# Patient Record
Sex: Female | Born: 1960
Health system: Southern US, Community
[De-identification: ages and names within clinical notes are randomized; demographics above are authoritative.]

## PROBLEM LIST (undated history)

## (undated) DIAGNOSIS — E739 Lactose intolerance, unspecified: Secondary | ICD-10-CM

## (undated) DIAGNOSIS — Z0000 Encounter for general adult medical examination without abnormal findings: Secondary | ICD-10-CM

## (undated) DIAGNOSIS — Z13220 Encounter for screening for lipoid disorders: Secondary | ICD-10-CM

## (undated) DIAGNOSIS — B029 Zoster without complications: Secondary | ICD-10-CM

## (undated) DIAGNOSIS — L290 Pruritus ani: Secondary | ICD-10-CM

## (undated) DIAGNOSIS — L309 Dermatitis, unspecified: Secondary | ICD-10-CM

## (undated) DIAGNOSIS — E7800 Pure hypercholesterolemia, unspecified: Secondary | ICD-10-CM

## (undated) HISTORY — DX: Encounter for screening for lipoid disorders: Z13.220

## (undated) HISTORY — DX: Dermatitis, unspecified: L30.9

## (undated) HISTORY — DX: Encounter for general adult medical examination without abnormal findings: Z00.00

## (undated) HISTORY — DX: Pure hypercholesterolemia, unspecified: E78.00

## (undated) HISTORY — PX: CHOLECYSTECTOMY: SHX55

## (undated) HISTORY — DX: Pruritus ani: L29.0

## (undated) HISTORY — DX: Zoster without complications: B02.9

## (undated) HISTORY — DX: Lactose intolerance, unspecified: E73.9

---

## 2005-09-26 ENCOUNTER — Encounter: Payer: Self-pay | Admitting: Family Medicine

## 2006-08-19 ENCOUNTER — Ambulatory Visit: Payer: Self-pay | Admitting: Family Medicine

## 2006-08-23 ENCOUNTER — Ambulatory Visit: Payer: Self-pay | Admitting: Family Medicine

## 2006-11-30 ENCOUNTER — Ambulatory Visit: Payer: Self-pay | Admitting: Family Medicine

## 2007-11-17 ENCOUNTER — Encounter: Payer: Self-pay | Admitting: Family Medicine

## 2007-11-17 DIAGNOSIS — E739 Lactose intolerance, unspecified: Secondary | ICD-10-CM | POA: Insufficient documentation

## 2007-11-20 ENCOUNTER — Ambulatory Visit: Payer: Self-pay | Admitting: Family Medicine

## 2007-11-27 ENCOUNTER — Ambulatory Visit: Payer: Self-pay | Admitting: Family Medicine

## 2007-12-04 DIAGNOSIS — E7800 Pure hypercholesterolemia, unspecified: Secondary | ICD-10-CM

## 2007-12-04 LAB — CONVERTED CEMR LAB
BUN: 11 mg/dL (ref 6–23)
CO2: 28 meq/L (ref 19–32)
Calcium: 9.5 mg/dL (ref 8.4–10.5)
Direct LDL: 149.8 mg/dL
GFR calc Af Amer: 99 mL/min
GFR calc non Af Amer: 82 mL/min
HDL: 46.1 mg/dL (ref >39.0)
Potassium: 4.5 meq/L (ref 3.5–5.1)
Total CHOL/HDL Ratio: 4.6
Triglycerides: 130 mg/dL (ref 0–149)
VLDL: 26 mg/dL (ref 0–40)

## 2007-12-12 ENCOUNTER — Ambulatory Visit: Payer: Self-pay | Admitting: Family Medicine

## 2007-12-12 ENCOUNTER — Encounter: Payer: Self-pay | Admitting: Family Medicine

## 2007-12-14 ENCOUNTER — Encounter (INDEPENDENT_AMBULATORY_CARE_PROVIDER_SITE_OTHER): Payer: Self-pay | Admitting: *Deleted

## 2008-02-01 ENCOUNTER — Ambulatory Visit: Payer: Self-pay | Admitting: Family Medicine

## 2008-03-05 ENCOUNTER — Ambulatory Visit: Payer: Self-pay | Admitting: Family Medicine

## 2008-03-08 LAB — CONVERTED CEMR LAB
Cholesterol: 198 mg/dL (ref 0–200)
LDL Cholesterol: 130 mg/dL — ABNORMAL HIGH (ref 0–99)

## 2008-07-30 ENCOUNTER — Ambulatory Visit: Payer: Self-pay | Admitting: Family Medicine

## 2008-08-28 ENCOUNTER — Ambulatory Visit: Payer: Self-pay | Admitting: Family Medicine

## 2008-08-28 LAB — CONVERTED CEMR LAB
Basophils Absolute: 0.1 10*3/uL (ref 0.0–0.1)
Basophils Relative: 1.7 % (ref 0.0–3.0)
HCT: 43.6 % (ref 36.0–46.0)
Hemoglobin: 15.2 g/dL — ABNORMAL HIGH (ref 12.0–15.0)
Lymphocytes Relative: 22 % (ref 12.0–46.0)
MCHC: 34.8 g/dL (ref 30.0–36.0)
Monocytes Absolute: 0.5 10*3/uL (ref 0.1–1.0)
Neutro Abs: 5.6 10*3/uL (ref 1.4–7.7)
RBC: 4.88 M/uL (ref 3.87–5.11)

## 2008-10-10 ENCOUNTER — Ambulatory Visit: Payer: Self-pay | Admitting: Family Medicine

## 2008-11-18 ENCOUNTER — Telehealth: Payer: Self-pay | Admitting: Family Medicine

## 2008-12-25 ENCOUNTER — Encounter: Payer: Self-pay | Admitting: Family Medicine

## 2008-12-25 ENCOUNTER — Other Ambulatory Visit: Admission: RE | Admit: 2008-12-25 | Discharge: 2008-12-25 | Payer: Self-pay | Admitting: Family Medicine

## 2008-12-25 ENCOUNTER — Ambulatory Visit: Payer: Self-pay | Admitting: Family Medicine

## 2008-12-31 ENCOUNTER — Encounter (INDEPENDENT_AMBULATORY_CARE_PROVIDER_SITE_OTHER): Payer: Self-pay | Admitting: *Deleted

## 2008-12-31 ENCOUNTER — Ambulatory Visit: Payer: Self-pay | Admitting: Family Medicine

## 2008-12-31 DIAGNOSIS — I10 Essential (primary) hypertension: Secondary | ICD-10-CM | POA: Insufficient documentation

## 2008-12-31 LAB — CONVERTED CEMR LAB
Albumin: 4.1 g/dL (ref 3.5–5.2)
BUN: 11 mg/dL (ref 6–23)
Cholesterol: 191 mg/dL (ref 0–200)
Creatinine, Ser: 0.8 mg/dL (ref 0.4–1.2)
GFR calc Af Amer: 98 mL/min
GFR calc non Af Amer: 81 mL/min
LDL Cholesterol: 112 mg/dL — ABNORMAL HIGH (ref 0–99)
Potassium: 4.4 meq/L (ref 3.5–5.1)
Triglycerides: 120 mg/dL (ref 0–149)
VLDL: 24 mg/dL (ref 0–40)

## 2009-01-01 ENCOUNTER — Encounter: Payer: Self-pay | Admitting: Family Medicine

## 2009-01-01 ENCOUNTER — Ambulatory Visit: Payer: Self-pay | Admitting: Family Medicine

## 2009-01-02 ENCOUNTER — Encounter: Payer: Self-pay | Admitting: Family Medicine

## 2009-01-03 ENCOUNTER — Encounter: Payer: Self-pay | Admitting: Family Medicine

## 2009-08-07 ENCOUNTER — Encounter (INDEPENDENT_AMBULATORY_CARE_PROVIDER_SITE_OTHER): Payer: Self-pay | Admitting: Internal Medicine

## 2009-08-07 ENCOUNTER — Ambulatory Visit: Payer: Self-pay | Admitting: Family Medicine

## 2009-08-07 LAB — CONVERTED CEMR LAB: Rapid Strep: NEGATIVE

## 2009-12-30 ENCOUNTER — Ambulatory Visit: Payer: Self-pay | Admitting: Family Medicine

## 2010-01-08 ENCOUNTER — Encounter: Payer: Self-pay | Admitting: Family Medicine

## 2010-01-08 ENCOUNTER — Ambulatory Visit: Payer: Self-pay | Admitting: Family Medicine

## 2010-01-09 ENCOUNTER — Ambulatory Visit: Payer: Self-pay | Admitting: Family Medicine

## 2010-01-12 LAB — CONVERTED CEMR LAB
Albumin: 4.5 g/dL (ref 3.5–5.2)
Alkaline Phosphatase: 66 units/L (ref 39–117)
Chloride: 102 meq/L (ref 96–112)
HDL: 63 mg/dL (ref >39)
LDL Cholesterol: 125 mg/dL — ABNORMAL HIGH (ref 0–99)
Potassium: 4.6 meq/L (ref 3.5–5.3)
Sodium: 140 meq/L (ref 135–145)
Total Protein: 7.9 g/dL (ref 6.0–8.3)
Triglycerides: 137 mg/dL (ref <150)
VLDL: 27 mg/dL (ref 0–40)

## 2010-05-29 ENCOUNTER — Ambulatory Visit: Payer: Self-pay | Admitting: Family Medicine

## 2010-05-29 DIAGNOSIS — N951 Menopausal and female climacteric states: Secondary | ICD-10-CM | POA: Insufficient documentation

## 2010-06-23 ENCOUNTER — Ambulatory Visit: Payer: Self-pay | Admitting: Family Medicine

## 2010-08-05 ENCOUNTER — Ambulatory Visit: Payer: Self-pay | Admitting: Internal Medicine

## 2010-08-05 DIAGNOSIS — H02849 Edema of unspecified eye, unspecified eyelid: Secondary | ICD-10-CM | POA: Insufficient documentation

## 2010-08-21 ENCOUNTER — Ambulatory Visit: Payer: Self-pay | Admitting: Family Medicine

## 2010-08-21 ENCOUNTER — Telehealth: Payer: Self-pay | Admitting: Family Medicine

## 2010-08-21 DIAGNOSIS — L2089 Other atopic dermatitis: Secondary | ICD-10-CM

## 2010-08-21 DIAGNOSIS — L299 Pruritus, unspecified: Secondary | ICD-10-CM | POA: Insufficient documentation

## 2010-12-09 ENCOUNTER — Telehealth: Payer: Self-pay | Admitting: Family Medicine

## 2011-01-11 ENCOUNTER — Encounter: Payer: Self-pay | Admitting: Family Medicine

## 2011-01-11 ENCOUNTER — Ambulatory Visit: Payer: Self-pay | Admitting: Family Medicine

## 2011-01-13 ENCOUNTER — Encounter (INDEPENDENT_AMBULATORY_CARE_PROVIDER_SITE_OTHER): Payer: Self-pay | Admitting: *Deleted

## 2011-01-26 NOTE — Assessment & Plan Note (Signed)
Summary: red and swollen eye/alc   Vital Signs:  Patient profile:   50 year old female Weight:      179.75 pounds Temp:     98.7 degrees F oral Pulse rate:   76 / minute Pulse rhythm:   regular BP sitting:   116 / 80  (left arm) Cuff size:   large  Vitals Entered By: Selena Batten Dance CMA (AAMA) (August 05, 2010 11:22 AM) CC: check eye/? exzema  Vision Screening:Left eye with correction: 20 / 25 Right eye with correction: 20 / 25 Both eyes with correction: 20 / 20        Vision Entered By: Selena Batten Dance CMA Duncan Dull) (August 05, 2010 12:12 PM)   History of Present Illness: CC: eye swelling  3d h/o periorbital itching started Sunday after spending time outside sweating, in AM eye very swollen and turned red.  No fevers, chills, no mouth swelling, trouble breathing or swallowing.  + using new mascara x 3-4 months, threw it away after right eye redness started.  + painful eye with lateral movements.  + h/o eczema, no h/o allergies.  + fm hx allergies and asthma, eczema.  No other rash, no n/v/d/c/abd pain/joint pain.  On prempro for menopause increased dose x 2 months, wonders if causing swelling of eye.  Allergies: No Known Drug Allergies PMH-FH-SH reviewed for relevance  Review of Systems       per HPI  Physical Exam  General:  overweight appearing female in NAd Head:  Normocephalic and atraumatic without obvious abnormalities. No apparent alopecia or balding. Eyes:  L eye WNL including fundoscopic exam.  R eye mild edema of eyelid, marked erythema periorbital region, PERRLA, conjunctiva white. EOMI, but some irritation with R eye lateral movement.  R fundscopic limited but no papilledema appreciated Ears:  External ear exam shows no significant lesions or deformities.  Otoscopic examination reveals clear canals, tympanic membranes are intact bilaterally without bulging, retraction, inflammation or discharge. Hearing is grossly normal bilaterally. Nose:  External nasal examination  shows no deformity or inflammation. Nasal mucosa are pink and moist without lesions or exudates. Mouth:  Oral mucosa and oropharynx without lesions or exudates.  Teeth in good repair. Neck:  No deformities, masses, or tenderness noted. Skin:  see eye exam.  no dry skin noted   Impression & Recommendations:  Problem # 1:  EDEMA OF EYELID (ICD-374.82)  with surrounding erythema.  likely allergic reaction to mascara recently used and sweat.  however, did have pain with eye movements today.  therefore concern for preseptal cellulitis.  Start Abx x 10 days, RTC for red flags and consider imaging to r/o orbital cellulitis picture.  No current fevers.  no systemic sxs.  Orders: Vision Screening ([address])  Complete Medication List: 1)  Prempro 0.625-2.5 Mg Tabs (Conj estrog-medroxyprogest ace) .Marland Kitchen.. 1 tab by mouth daily 2)  Augmentin 875-125 Mg Tabs (Amoxicillin-pot clavulanate) .... One two times a day x 10 days  Patient Instructions: 1)  This is likely an allergic reaction of your skin to something in the mascara or environment on Sunday. 2)  However, given you are having pain with eye movements, I'd like you to start antibiotic and take two times daily for next 10 days.  3)  No make up for next 2 weeks. 4)  Please return if having fevers, worsening swelling of eye or more pain with eye movements, or not improving as expected. 5)  Call clinic with questions.  Pleasure to meet you today. Prescriptions: AUGMENTIN  875-125 MG TABS (AMOXICILLIN-POT CLAVULANATE) one two times a day x 10 days  #20 x 0   Entered and Authorized by:   Eustaquio Boyden  MD   Signed by:   Eustaquio Boyden  MD on 08/05/2010   Method used:   Electronically to        [facility]       [address]       [place], [place]  [address]       Ph: [phone]       Fax: [phone]   RxID:   [id number]   Current Allergies (reviewed today): No known allergies

## 2011-01-26 NOTE — Assessment & Plan Note (Signed)
Summary: ROA FOR 3-4 WEEK FOLLOW-UP   Vital Signs:  Patient profile:   50 year old female Height:      63.25 inches Weight:      177.4 pounds BMI:     31.29 Temp:     98.8 degrees F oral Pulse rate:   80 / minute Pulse rhythm:   regular BP sitting:   110 / 70  (left arm) Cuff size:   regular  Vitals Entered By: Benny Lennert CMA Duncan Dull) (June 23, 2010 4:11 PM)  History of Present Illness: Chief complaint 3-4 week follow up  Seen 1 month ago for menopausal symptoms as detailed below:  Mood swings.. quick swithces. Increase in stress. Noted in last 2-3 year... worsening in last few months. Hot flashes at night as well as during the day. Exercise a lot..but having difficulty losing weiht.  Very fatigued. Last menses was 3 months ago, lighter flow. No intermittant bleeding . Very irregular menses in last few years. Mother went through menopause age 82.   In last month she has been on HRT... minimal change in symptoms.   Allergies (verified): No Known Drug Allergies  Past History:  Past medical, surgical, family and social histories (including risk factors) reviewed, and no changes noted (except as noted below).  Past Medical History: Reviewed history from 10/10/2008 and no changes required. Current Problems:  ANAL PRURITUS (ICD-698.0) HERPES ZOSTER (ICD-053.9) HYPERCHOLESTEROLEMIA (ICD-272.0) SCREENING FOR LIPOID DISORDERS (ICD-V77.91) PREVENTIVE HEALTH CARE (ICD-V70.0) OTHER SCREENING BREAST EXAMINATION (ICD-V76.19) GLUCOSE INTOLERANCE (ICD-271.3) History of Eczema  Past Surgical History: Reviewed history from 11/17/2007 and no changes required. 1999    Cholecystectomy  Family History: Reviewed history from 11/17/2007 and no changes required. Father: Died 69 throat and lung CA Mother: Died 29, DM, kidney failure Siblings: 1 brother DM, 1 sister healthy DM:  brother  Social History: Reviewed history from 12/25/2008 and no changes required. Former Smoker,  15 PYH Alcohol use-yes, light (2-3 x /week) Drug use-no Regular exercise-yes, walks stairs daily 20-25 minutes, runs daily Marital Status: Married x 20 years Children: 1 Occupation: Housewife Nutrition:  3 meals, fruit and vegetables  Review of Systems General:  Complains of fatigue. CV:  Denies chest pain or discomfort. Resp:  Denies shortness of breath.   Impression & Recommendations:  Problem # 1:  MENOPAUSAL SYNDROME (ICD-627.2) Increase medication dose. Encouraged exercise, weight loss, healthy eating habits.  Her updated medication list for this problem includes:    Prempro 0.625-2.5 Mg Tabs (Conj estrog-medroxyprogest ace) .Marland Kitchen... 1 tab by mouth daily  Complete Medication List: 1)  Prempro 0.625-2.5 Mg Tabs (Conj estrog-medroxyprogest ace) .Marland Kitchen.. 1 tab by mouth daily Prescriptions: PREMPRO 0.625-2.5 MG TABS (CONJ ESTROG-MEDROXYPROGEST ACE) 1 tab by mouth daily  #30 x 3   Entered and Authorized by:   Kerby Nora MD   Signed by:   Kerby Nora MD on 06/23/2010   Method used:   Electronically to        [facility]       [address]       [place], [place]  [address]       Ph: [phone]       Fax: [phone]   RxID:   [id number]   Current Allergies (reviewed today): No known allergies

## 2011-01-26 NOTE — Assessment & Plan Note (Signed)
Summary: PROBLEMS WITH MENAPAUSE/RBH 3O MIN PER HEATHER   Vital Signs:  Patient profile:   50 year old female Height:      63.25 inches Weight:      179.0 pounds BMI:     31.57 Temp:     98.2 degrees F oral Pulse rate:   80 / minute Pulse rhythm:   regular BP sitting:   120 / 78  (left arm) Cuff size:   regular  Vitals Entered By: Benny Lennert CMA Duncan Dull) (May 29, 2010 3:25 PM)  History of Present Illness: Mood swings.. quick swithces. Increase in stress. Noted in last 2-3 year... worsening in last few months. Hot flashes at night as well as during the day. Exercise a lot..but having difficulty losing weiht.  Very fatigued.  Last menses was 3 months ago, lighter flow. No intermittant bleeding . Very irregular menses in last few years. Mother went through menopause age 80.  Problems Prior to Update: 1)  Essential Hypertension, Benign  (ICD-401.1) 2)  Routine Gynecological Examination  (ICD-V72.31) 3)  Skin Rash  (ICD-782.1) 4)  Other Screening Mammogram  (ICD-V76.12) 5)  Eczema, Atopic  (ICD-691.8) 6)  Metrorrhagia  (ICD-626.6) 7)  Anal Pruritus  (ICD-698.0) 8)  Herpes Zoster  (ICD-053.9) 9)  Hypercholesterolemia  (ICD-272.0) 10)  Screening For Lipoid Disorders  (ICD-V77.91) 11)  Preventive Health Care  (ICD-V70.0) 12)  Other Screening Breast Examination  (ICD-V76.19) 13)  Glucose Intolerance  (ICD-271.3)  Current Medications (verified): 1)  Prempro 0.3-1.5 Mg Tabs (Conj Estrog-Medroxyprogest Ace) .Marland Kitchen.. 1 Tab By Mouth Daily  Allergies (verified): No Known Drug Allergies  Past History:  Past medical, surgical, family and social histories (including risk factors) reviewed, and no changes noted (except as noted below).  Past Medical History: Reviewed history from 10/10/2008 and no changes required. Current Problems:  ANAL PRURITUS (ICD-698.0) HERPES ZOSTER (ICD-053.9) HYPERCHOLESTEROLEMIA (ICD-272.0) SCREENING FOR LIPOID DISORDERS (ICD-V77.91) PREVENTIVE  HEALTH CARE (ICD-V70.0) OTHER SCREENING BREAST EXAMINATION (ICD-V76.19) GLUCOSE INTOLERANCE (ICD-271.3) History of Eczema  Past Surgical History: Reviewed history from 11/17/2007 and no changes required. 1999    Cholecystectomy  Family History: Reviewed history from 11/17/2007 and no changes required. Father: Died 6 throat and lung CA Mother: Died 99, DM, kidney failure Siblings: 1 brother DM, 1 sister healthy DM:  brother  Social History: Reviewed history from 12/25/2008 and no changes required. Former Smoker, 15 PYH Alcohol use-yes, light (2-3 x /week) Drug use-no Regular exercise-yes, walks stairs daily 20-25 minutes, runs daily Marital Status: Married x 20 years Children: 1 Occupation: Housewife Nutrition:  3 meals, fruit and vegetables  Review of Systems General:  Complains of fatigue; denies fever. CV:  Denies chest pain or discomfort. Resp:  Denies shortness of breath. GI:  Denies abdominal pain.  Physical Exam  General:  overweight appearing female in NAd Mouth:  Oral mucosa and oropharynx without lesions or exudates.  Teeth in good repair. Neck:  no carotid bruit or thyromegaly no cervical or supraclavicular lymphadenopathy  Lungs:  Normal respiratory effort, chest expands symmetrically. Lungs are clear to auscultation, no crackles or wheezes. Heart:  Normal rate and regular rhythm. S1 and S2 normal without gallop, murmur, click, rub or other extra sounds. Abdomen:  Bowel sounds positive,abdomen soft and non-tender without masses, organomegaly or hernias noted. Pulses:  R and L posterior tibial pulses are full and equal bilaterally  Extremities:  no edema   Impression & Recommendations:  Problem # 1:  MENOPAUSAL SYNDROME (ICD-627.2)  Her updated medication list for this  problem includes:    Prempro 0.3-1.5 Mg Tabs (Conj estrog-medroxyprogest ace) .Marland Kitchen... 1 tab by mouth daily  Discussed treatment options and cardiac risk factors associated with estrogen  replacement. Pt is other wise lo risk for CAD.Marland Kitchenno HTN, mild high chol and no curent smoking, no DM. She accepts and understands theincreased risk associated with estrogen replacemnt and feels her symptoms are moderate to severe.  Will treat with estrogen and progesterone given uterus remain. Will titrate up as needed. Consider antidepressant if mood issues remain.   Complete Medication List: 1)  Prempro 0.3-1.5 Mg Tabs (Conj estrog-medroxyprogest ace) .Marland Kitchen.. 1 tab by mouth daily  Patient Instructions: 1)  Start low dose estrogen/progesterone daily. 2)   Follow up in 3-4 weeks.  Prescriptions: PREMPRO 0.3-1.5 MG TABS (CONJ ESTROG-MEDROXYPROGEST ACE) 1 tab by mouth daily  #30 x 5   Entered and Authorized by:   Kerby Nora MD   Signed by:   Kerby Nora MD on 05/29/2010   Method used:   Electronically to        [facility]       [address]       [place], [place]  [address]       Ph: [phone]       Fax: [phone]   RxID:   [id number]   Prior Medications (reviewed today): None Current Allergies (reviewed today): No known allergies    Past Medical History:    Reviewed history from 10/10/2008 and no changes required:       Current Problems:        ANAL PRURITUS (ICD-698.0)       HERPES ZOSTER (ICD-053.9)       HYPERCHOLESTEROLEMIA (ICD-272.0)       SCREENING FOR LIPOID DISORDERS (ICD-V77.91)       PREVENTIVE HEALTH CARE (ICD-V70.0)       OTHER SCREENING BREAST EXAMINATION (ICD-V76.19)       GLUCOSE INTOLERANCE (ICD-271.3)       History of Eczema  Past Surgical History:    Reviewed history from 11/17/2007 and no changes required:       1999    Cholecystectomy

## 2011-01-26 NOTE — Assessment & Plan Note (Signed)
Summary: CPX/DLO   Vital Signs:  Patient profile:   50 year old female Height:      63.25 inches Weight:      172.4 pounds BMI:     30.41 Temp:     98.3 degrees F oral Pulse rate:   80 / minute Pulse rhythm:   regular BP sitting:   120 / 80  (left arm) Cuff size:   regular  Vitals Entered By: Benny Lennert CMA Duncan Dull) (December 30, 2009 11:06 AM)  History of Present Illness: Chief complaint cpx  The patient is here for annual wellness exam and preventative care.   Doing well overall. Walking 3 miles a day.   Menses irregular.Marland Kitchenocc hot and cold flashes...not bothering her mch.   Hypertension History:      BP well controlled at home. Marland Kitchen        Positive major cardiovascular risk factors include hyperlipidemia and hypertension.  Negative major cardiovascular risk factors include female age less than 67 years old and non-tobacco-user status.     Problems Prior to Update: 1)  Essential Hypertension, Benign  (ICD-401.1) 2)  Routine Gynecological Examination  (ICD-V72.31) 3)  Skin Rash  (ICD-782.1) 4)  Other Screening Mammogram  (ICD-V76.12) 5)  Eczema, Atopic  (ICD-691.8) 6)  Metrorrhagia  (ICD-626.6) 7)  Anal Pruritus  (ICD-698.0) 8)  Herpes Zoster  (ICD-053.9) 9)  Hypercholesterolemia  (ICD-272.0) 10)  Screening For Lipoid Disorders  (ICD-V77.91) 11)  Preventive Health Care  (ICD-V70.0) 12)  Other Screening Breast Examination  (ICD-V76.19) 13)  Glucose Intolerance  (ICD-271.3)  Current Medications (verified): 1)  None  Allergies (verified): No Known Drug Allergies  Past History:  Past medical, surgical, family and social histories (including risk factors) reviewed, and no changes noted (except as noted below).  Past Medical History: Reviewed history from 10/10/2008 and no changes required. Current Problems:  ANAL PRURITUS (ICD-698.0) HERPES ZOSTER (ICD-053.9) HYPERCHOLESTEROLEMIA (ICD-272.0) SCREENING FOR LIPOID DISORDERS (ICD-V77.91) PREVENTIVE HEALTH CARE  (ICD-V70.0) OTHER SCREENING BREAST EXAMINATION (ICD-V76.19) GLUCOSE INTOLERANCE (ICD-271.3) History of Eczema  Past Surgical History: Reviewed history from 11/17/2007 and no changes required. 1999    Cholecystectomy  Family History: Reviewed history from 11/17/2007 and no changes required. Father: Died 59 throat and lung CA Mother: Died 48, DM, kidney failure Siblings: 1 brother DM, 1 sister healthy DM:  brother  Social History: Reviewed history from 12/25/2008 and no changes required. Former Smoker, 15 PYH Alcohol use-yes, light (2-3 x /week) Drug use-no Regular exercise-yes, walks stairs daily 20-25 minutes, runs daily Marital Status: Married x 20 years Children: 1 Occupation: Housewife Nutrition:  3 meals, fruit and vegetables  Review of Systems General:  Denies fatigue and fever. CV:  Denies chest pain or discomfort. Resp:  Denies shortness of breath. GI:  Denies abdominal pain, bloody stools, constipation, and diarrhea. GU:  Denies abnormal vaginal bleeding, dysuria, and hematuria. Derm:  vaginal rash resolved since stopping yogurt. Psych:  Denies anxiety and depression.  Physical Exam  General:  overweight appearing female in NAd Head:  Normocephalic and atraumatic without obvious abnormalities. No apparent alopecia or balding. Ears:  External ear exam shows no significant lesions or deformities.  Otoscopic examination reveals clear canals, tympanic membranes are intact bilaterally without bulging, retraction, inflammation or discharge. Hearing is grossly normal bilaterally. Nose:  External nasal examination shows no deformity or inflammation. Nasal mucosa are pink and moist without lesions or exudates. Mouth:  MMMgood dentition and pharynx pink and moist.   Neck:  no carotid bruit or thyromegaly  no cervical or supraclavicular lymphadenopathy  Chest Wall:  No deformities, masses, or tenderness noted. Breasts:  No mass, nodules, thickening, tenderness, bulging,  retraction, inflamation, nipple discharge or skin changes noted.   Lungs:  Normal respiratory effort, chest expands symmetrically. Lungs are clear to auscultation, no crackles or wheezes. Heart:  Normal rate and regular rhythm. S1 and S2 normal without gallop, murmur, click, rub or other extra sounds. Abdomen:  Bowel sounds positive,abdomen soft and non-tender without masses, organomegaly or hernias noted. Genitalia:  Not due until next year.  Msk:  No deformity or scoliosis noted of thoracic or lumbar spine.   Pulses:  R and L posterior tibial pulses are full and equal bilaterally  Extremities:  no edema Neurologic:  No cranial nerve deficits noted. Station and gait are normal. Plantar reflexes are down-going bilaterally. DTRs are symmetrical throughout. Sensory, motor and coordinative functions appear intact. Skin:  Intact without suspicious lesions or rashes Psych:  Cognition and judgment appear intact. Alert and cooperative with normal attention span and concentration. No apparent delusions, illusions, hallucinations   Impression & Recommendations:  Problem # 1:  Preventive Health Care (ICD-V70.0) Reviewed preventive care protocols, scheduled due services, and updated immunizations. Encouraged exercise, weight loss, healthy eating habits.   Problem # 2:  HYPERCHOLESTEROLEMIA (ICD-272.0)  Due for reeval.   Labs Reviewed: SGOT: 16 (12/31/2008)   SGPT: 15 (12/31/2008)  10 Yr Risk Heart Disease: 4 %   HDL:55.5 (12/31/2008), 47.6 (03/05/2008)  LDL:112 (12/31/2008), 130 (03/05/2008)  Chol:191 (12/31/2008), 198 (03/05/2008)  Trig:120 (12/31/2008), 103 (03/05/2008)  Problem # 3:  ESSENTIAL HYPERTENSION, BENIGN (ICD-401.1) Well controlled. Encouraged exercise, weight loss, healthy eating habits.  BP today: 120/80 Prior BP: 100/70 (08/07/2009)  10 Yr Risk Heart Disease: 4 %  Labs Reviewed: K+: 4.4 (12/31/2008) Creat: : 0.8 (12/31/2008)   Chol: 191 (12/31/2008)   HDL: 55.5 (12/31/2008)    LDL: 112 (12/31/2008)   TG: 120 (12/31/2008)  Other Orders: Radiology Referral (Radiology)  Hypertension Assessment/Plan:      The patient's hypertensive risk group is category B: At least one risk factor (excluding diabetes) with no target organ damage.  Her calculated 10 year risk of coronary heart disease is 4 %.  Today's blood pressure is 120/80.  Her blood pressure goal is < 140/90.  Patient Instructions: 1)  Referral Appointment Information 2)  Day/Date: 3)  Time: 4)  Place/MD: 5)  Address: 6)  Phone/Fax: 7)  Patient given appointment information. Information/Orders faxed/mailed.  8)  Fasting lipids, CMET Dx 401.1  Current Allergies (reviewed today): No known allergies   Flu Vaccine Next Due:  Refused Last PAP:  normal (12/25/2008 11:57:35 AM) PAP Next Due:  2 yr Last Mammogram:  normal (01/02/2009 2:08:07 PM) Mammogram Next Due:  1 yr

## 2011-01-26 NOTE — Progress Notes (Signed)
Summary: cant afford cream  Phone Note Call from Patient Call back at Home Phone [phone] Call back at [phone]   Caller: Patient Summary of Call: Pt was seen this morning, by Dr Patsy Lager, for infection around her eye.  She picked up 2 of the creams that she was prescribed but not the elidol, it was over $400.00.  She is asking if she can get something else.  She is waiting in the lobby.               Lowella Petties CMA  August 21, 2010 3:18 PM   Follow-up for Phone Call        I have to defer to Dr. Patsy Lager on this matter.  I could not change the cream w/o his input.  Since he is out of the office this afternoon, his input would be needed on Monday.  Follow-up by: Crawford Givens MD,  August 21, 2010 3:23 PM  Additional Follow-up for Phone Call Additional follow up Details #1::        Advised pt, she will wait until monday.   [facility] [place].        Lowella Petties CMA  August 21, 2010 3:24 PM     Additional Follow-up for Phone Call Additional follow up Details #2::    she can use the hydrocortisone that I already prescribed around her eye for 1-2 weeks not long term  elidel was an option for long term use. there is not a cheaper option  can pulse dose the hydrocortisone around eyes every 2 weeks for 2-3 days if needed after this initial 1-2 weeks straight Follow-up by: Hannah Beat MD,  August 22, 2010 5:27 PM  Additional Follow-up for Phone Call Additional follow up Details #3:: Details for Additional Follow-up Action Taken: Patient advised.Consuello Masse CMA   Additional Follow-up by: Benny Lennert CMA Duncan Dull),  August 24, 2010 1:57 PM

## 2011-01-26 NOTE — Assessment & Plan Note (Signed)
Summary: ?EYE INFECTION/CLE   Vital Signs:  Patient profile:   50 year old female Height:      63.25 inches Weight:      178.2 pounds BMI:     31.43 Temp:     98.8 degrees F oral Pulse rate:   76 / minute Pulse rhythm:   regular BP sitting:   112 / 70  (left arm) Cuff size:   large  Vitals Entered By: Benny Lennert CMA Duncan Dull) (August 21, 2010 11:00 AM)  History of Present Illness: Chief complaint eye infection  51 year old female with a lifelong history of eczema presents with some irritation and redness around  her eyes and eyelids, and additional eczema flare  and multiple other spots around her body including her hands and other areas.  There is scaling , dryness and irritation throughout all areas involved.  There is no redness or warmth.  There is nothing involving the conjunctiva. Freely moves her eye. There is no significant swelling.  REVIEW OF SYSTEMS GEN: No acute illnesses, no fever, chills, sweats. CV: No chest pain or SOB GI: No noted N or V Otherwise, pertinent positives and negatives are noted in the HPI.     Allergies (verified): No Known Drug Allergies  Past History:  Past medical, surgical, family and social histories (including risk factors) reviewed, and no changes noted (except as noted below).  Past Medical History: Reviewed history from 10/10/2008 and no changes required. Current Problems:  ANAL PRURITUS (ICD-698.0) HERPES ZOSTER (ICD-053.9) HYPERCHOLESTEROLEMIA (ICD-272.0) SCREENING FOR LIPOID DISORDERS (ICD-V77.91) PREVENTIVE HEALTH CARE (ICD-V70.0) OTHER SCREENING BREAST EXAMINATION (ICD-V76.19) GLUCOSE INTOLERANCE (ICD-271.3) History of Eczema  Past Surgical History: Reviewed history from 11/17/2007 and no changes required. 1999    Cholecystectomy  Family History: Reviewed history from 11/17/2007 and no changes required. Father: Died 30 throat and lung CA Mother: Died 58, DM, kidney failure Siblings: 1 brother DM, 1 sister  healthy DM:  brother  Social History: Reviewed history from 12/25/2008 and no changes required. Former Smoker, 15 PYH Alcohol use-yes, light (2-3 x /week) Drug use-no Regular exercise-yes, walks stairs daily 20-25 minutes, runs daily Marital Status: Married x 20 years Children: 1 Occupation: Housewife Nutrition:  3 meals, fruit and vegetables  Physical Exam  Eyes:  Pupils equal round reactive to light and accommodation, full range of motion. Along the eyelids and around the adjacent skin above and below the eye there is some dry  redness and scale. Skin:  multiple areas around the body with significant scaling, worse in the hands   ENT Exam:  General:     In no acute distress.   Head:     Normocephalic and atraumatic.   Eyes:     EOM intact, clear sclera.   Left Ear:     External: Pinna and periauricular area is normal.   Right Ear:     External: Pinna and periauricular area is normal.   Nose:     External: No deformity or lesions noted.   Pharynx:     Oral Cavity: Lips, gums, and teeth are unremarkable. Tongue is mobile.      Oropharynx: Pharynx without signs of inflammation, tonsils unremarkable, palate with bilateral rise.  Neck:     Lymph Nodes: no palpable lymphadenopathy. Lungs:     Symmetric chest wall rise, no labored breathing.   Heart:     Regular rate and rhythm, S1, S2 without murmurs, rubs, gallops, or clicks.     Impression & Recommendations:  Problem # 1:  ECZEMA, ATOPIC (ICD-691.8) classic eczema flare, symptomatic treatment  Her updated medication list for this problem includes:    Hydrocortisone 2.5 % Oint (Hydrocortisone) .[facility]... Apply two times a day around face (no more than 1 week)    Fluocinolone Acetonide 0.01 % Crea (Fluocinolone acetonide) .[facility]... Apply two times a day to body or hands  Problem # 2:  PRURITUS (ICD-698.9)  Problem # 3:  EDEMA OF EYELID (ICD-374.82)  Complete Medication List: 1)  Prempro 0.625-2.5 Mg Tabs (Conj  estrog-medroxyprogest ace) .Mar[facility] Kitchen.. 1 tab by mouth daily 2)  Hydrocortisone 2.5 % Oint (Hydrocortisone) .... Apply two times a day around face (no more than 1 week) 3)  Elidel 1 % Crea (Pimecrolimus) .... Apply two times a day around eyelids (safe year round) 4)  Fluocinolone Acetonide 0.01 % Crea (Fluocinolone acetonide) .... Apply two times a day to body or hands Prescriptions: FLUOCINOLONE ACETONIDE 0.01 % CREA (FLUOCINOLONE ACETONIDE) Apply two times a day to body or hands  #60 grams x 5   Entered and Authorized by:   Hannah Beat MD   Signed by:   Hannah Beat MD on 08/21/2010   Method used:   Electronically to        [facility] (retail)       [address]       [place], [place]  [address]       Ph: [phone]       Fax: [phone]   RxID:   [id number] ELIDEL 1 % CREA (PIMECROLIMUS) Apply two times a day around eyelids (safe year round)  #100 grams x 5   Entered and Authorized by:   Hannah Beat MD   Signed by:   Hannah Beat MD on 08/21/2010   Method used:   Electronically to        [facility]       [address]       [place], [place]  [address]       Ph: [phone]       Fax: [phone]   RxID:   [id number] HYDROCORTISONE 2.5 % OINT (HYDROCORTISONE) Apply two times a day around face (no more than 1 week)  #30 grams x 0   Entered and Authorized by:   Hannah Beat MD   Signed by:   Hannah Beat MD on 08/21/2010   Method used:   Electronically to        [facility] ([facility])       [address]       [place], [place]  [address]       Ph: [phone]       Fax: [phone]   RxID:   [id number]   Current Allergies (reviewed today): No known allergies

## 2011-01-28 NOTE — Letter (Signed)
Summary: Results Follow up Letter  [place] [facility]  [address] [place], [place] [address]   Phone: [phone]  Fax: [phone]    01/13/2011 MRN: [id number]      [facility] [address] [place], [place]  [address]    Dear Kara Burke,  The following are the results of your recent test(s):  Test         Result    Pap Smear:        Normal _____  Not Normal _____ Comments: ______________________________________________________ Cholesterol: LDL(Bad cholesterol):         Your goal is less than:         HDL (Good cholesterol):       Your goal is more than: Comments:  ______________________________________________________ Mammogram:        Normal __x___  Not Normal _____ Comments:Repeat in 1 year  ___________________________________________________________________ Hemoccult:        Normal _____  Not normal _______ Comments:    _____________________________________________________________________ Other Tests:    We routinely do not discuss normal results over the telephone.  If you desire a copy of the results, or you have any questions about this information we can discuss them at your next office visit.   Sincerely,  Kerby Nora MD

## 2011-01-28 NOTE — Progress Notes (Signed)
Summary: Need Mammogram order   Phone Note Call from Patient   Caller: Patient Call For: [phone] Summary of Call: Pt would like an order to have a annual mammogram to [facility]. Pt says she will call back for her annual cpx. Home # [phone] or cell # [phone].Daine Gip  December 09, 2010 2:54 PM  Initial call taken by: Daine Gip,  December 09, 2010 2:54 PM

## 2011-05-14 NOTE — Assessment & Plan Note (Signed)
HEALTHCARE                             [facility] OFFICE NOTE   NAME:Wilsey, NEL                    MRN:          [id number]  DATE:08/19/2006                            DOB:          1961-07-04    CHIEF COMPLAINT:  A 50 year old, white female new to the area here to  establish new doctor.   HISTORY OF PRESENT ILLNESS:  Ms. Glennie Hawk states that she has been  doing well over the past few years.  She states her only concern today is  the fact that her hair has been thinning across the front.  She states her  mother does also have thin hair.  She states that she also does have dry  skin and some cold intolerance.  Occasionally, she has swelling in her feet  after being on them all day intermittently.  She has never had a thyroid  hormone checked.  She is interested in topical medication for thinning hair.   PAST MEDICAL HISTORY:  None.   PAST SURGICAL HISTORY:  1. In 1999, cholecystectomy.  2. In November 2006, mammogram.  3. In October 2006, Pap smear.  4. Tetanus in 2003.   MEDICATIONS:  None.   ALLERGIES:  No known drug allergies.   REVIEW OF SYSTEMS:  Otherwise negative.   FAMILY HISTORY:  Father deceased at age 67 with throat and lung cancer.  Mother deceased at age 15 with diabetes and kidney failure.  She has one  brother who does have diabetes and she has one sister who is healthy.  There  is no other family history of cancer, depression, alcohol or drug abuse.   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  She  walks about 20-25 minutes daily and frequently goes up and down the stairs.  For nutrition, she gets three meals a day including fruits and vegetables.  She has been married 20 years happily.  She has a 15-pack-year history of  former tobacco use.  She drinks about two to three drinks per week and she  denies IV drug use.   PHYSICAL EXAMINATION:  VITAL SIGNS:  Height 63-1/4 inches,  weight 172 making  BMI 29.  Blood pressure 152/90, pulse 80, temperature 98.2.  HEENT:  PERRLA.  Extraocular movements intact.  Retina within normal limits.  Oropharynx clear.  Nares clear.  Tympanic membranes clear.  NECK:  No thyromegaly.  No lymphadenopathy.  LUNGS:  Clear to auscultation bilaterally.  No wheezes, rales or rhonchi.  No cyanosis.  CARDIAC:  Regular rate and rhythm, no murmurs, rubs or gallops.  ABDOMEN:  Soft, nontender, normoactive bowel sounds.  No hepatosplenomegaly.  MUSCULOSKELETAL:  Strength 5/5 in upper and lower extremities.  EXTREMITIES:  No peripheral edema.  Reflexes 2+.  NEUROLOGIC:  Alert and oriented x3.   ASSESSMENT/PLAN:  1. Alopecia.  Her thinning hair is most likely androgenic alopecia.  With      her other symptoms, I do have some concern that her thyroid function      may be abnormal.  We will have her return to  check a thyroid simulating      hormone.  Following this, if this is normal, than we can start some      topical Rogaine for woman.  2. Elevated blood pressure.  Ms. Glennie Hawk states that she has never      had elevated blood pressures previously.  We do have this one elevated      number, but will not make treatment decisions based on this quite yet.      She will measure her blood pressure at home over the next few weeks.      If it is consistently above 140/90, she will contact me.  3. Prevention.  She will be due for her mammogram in November of this      year.  She is due for cholesterol screen and given her family history,      will also screen her for diabetes.  She will return for fasting labs.      Following the receipt of the lab results, we will contact her with      whether she needs a followup appointment.                                   Kerby Nora, MD   AB/MedQ  DD:  08/19/2006  DT:  08/19/2006  Job #:  [id number]

## 2012-01-04 ENCOUNTER — Encounter: Payer: Self-pay | Admitting: Family Medicine

## 2012-01-05 ENCOUNTER — Encounter: Payer: Self-pay | Admitting: Family Medicine

## 2012-01-05 ENCOUNTER — Ambulatory Visit (INDEPENDENT_AMBULATORY_CARE_PROVIDER_SITE_OTHER): Payer: Self-pay | Admitting: Family Medicine

## 2012-01-05 ENCOUNTER — Other Ambulatory Visit (HOSPITAL_COMMUNITY)
Admission: RE | Admit: 2012-01-05 | Discharge: 2012-01-05 | Disposition: A | Discharge status: Home / Self Care | Payer: BC Managed Care – PPO | Source: Ambulatory Visit | Attending: Family Medicine | Admitting: Family Medicine

## 2012-01-05 VITALS — BP 130/70 | HR 67 | Temp 98.9°F | Ht 63.0 in | Wt 188.8 lb

## 2012-01-05 DIAGNOSIS — Z1231 Encounter for screening mammogram for malignant neoplasm of breast: Secondary | ICD-10-CM

## 2012-01-05 DIAGNOSIS — E739 Lactose intolerance, unspecified: Secondary | ICD-10-CM

## 2012-01-05 DIAGNOSIS — Z01419 Encounter for gynecological examination (general) (routine) without abnormal findings: Secondary | ICD-10-CM | POA: Insufficient documentation

## 2012-01-05 DIAGNOSIS — I10 Essential (primary) hypertension: Secondary | ICD-10-CM

## 2012-01-05 DIAGNOSIS — E7800 Pure hypercholesterolemia, unspecified: Secondary | ICD-10-CM

## 2012-01-05 NOTE — Progress Notes (Signed)
Subjective:    Patient ID: Kara Burke, female    DOB: 04-02-1961, 51 y.o.   MRN: [id number]  HPI  The patient is here for annual wellness exam and preventative care.    Hypertension:   Well controlled on no medications.  Using medication without problems or lightheadedness:  None Chest pain with exertion:None Edema:None Short of breath:None Average home GEX:BMWUXL Other issues:  Menopausal symptoms.. Hot flashes, tolerable.  HRT did not help much, so stopped.  Elevated Cholesterol: Diet compliance: Moderate Exercise: Daily walking on treadmill, bike, weights Other complaints:  Glucose intolerance. Due for re-eval.    Review of Systems  Constitutional: Negative for fever, fatigue and unexpected weight change.  HENT: Negative for ear pain, congestion, sore throat, sneezing, trouble swallowing and sinus pressure.   Eyes: Negative for pain and itching.  Respiratory: Negative for cough, shortness of breath and wheezing.   Cardiovascular: Negative for chest pain, palpitations and leg swelling.  Gastrointestinal: Negative for nausea, abdominal pain, diarrhea, constipation and blood in stool.  Genitourinary: Negative for dysuria, hematuria, vaginal discharge, difficulty urinating and menstrual problem.  Skin: Negative for rash.  Neurological: Negative for syncope, weakness, light-headedness, numbness and headaches.  Psychiatric/Behavioral: Negative for confusion and dysphoric mood. The patient is not nervous/anxious.        Objective:   Physical Exam  Constitutional: Vital signs are normal. She appears well-developed and well-nourished. She is cooperative.  Non-toxic appearance. She does not appear ill. No distress.  HENT:  Head: Normocephalic.  Right Ear: Hearing, tympanic membrane, external ear and ear canal normal.  Left Ear: Hearing, tympanic membrane, external ear and ear canal normal.  Nose: Nose normal.  Eyes: Conjunctivae, EOM and lids are normal. Pupils  are equal, round, and reactive to light. No foreign bodies found.  Neck: Trachea normal and normal range of motion. Neck supple. Carotid bruit is not present. No mass and no thyromegaly present.  Cardiovascular: Normal rate, regular rhythm, S1 normal, S2 normal, normal heart sounds and intact distal pulses.  Exam reveals no gallop.   No murmur heard. Pulmonary/Chest: Effort normal and breath sounds normal. No respiratory distress. She has no wheezes. She has no rhonchi. She has no rales.  Abdominal: Soft. Normal appearance and bowel sounds are normal. She exhibits no distension, no fluid wave, no abdominal bruit and no mass. There is no hepatosplenomegaly. There is no tenderness. There is no rebound, no guarding and no CVA tenderness. No hernia.  Genitourinary: Vagina normal and uterus normal. No breast swelling, tenderness, discharge or bleeding. Pelvic exam was performed with patient prone. There is no rash, tenderness or lesion on the right labia. There is no rash, tenderness or lesion on the left labia. Uterus is not enlarged and not tender. Cervix exhibits no motion tenderness, no discharge and no friability. Right adnexum displays no mass, no tenderness and no fullness. Left adnexum displays no mass, no tenderness and no fullness.  Lymphadenopathy:    She has no cervical adenopathy.    She has no axillary adenopathy.  Neurological: She is alert. She has normal strength. No cranial nerve deficit or sensory deficit.  Skin: Skin is warm, dry and intact. No rash noted.  Psychiatric: Her speech is normal and behavior is normal. Judgment normal. Her mood appears not anxious. Cognition and memory are normal. She does not exhibit a depressed mood.          Assessment & Plan:  CPX: The patient's preventative maintenance and recommended screening tests for an annual wellness  exam were reviewed in full today. Brought up to date unless services declined.  Counselled on the importance of diet,  exercise, and its role in overall health and mortality. The patient's FH and SH was reviewed, including their home life, tobacco status, and drug and alcohol status.   Vaccines: uptodate with Td, given flu. Colon:No family history... Not interested in colonoscopy or hemeoccults at this time.  Mammo: 01/11/2011  DVE/pap: last pap nml 2009, on 3 year pap schedule, performed today Former smoker

## 2012-01-05 NOTE — Patient Instructions (Addendum)
Return for fasting labs  In next few weeks. Stop at front office to see Shirlee Limerick to set up mammogram.

## 2012-01-06 ENCOUNTER — Other Ambulatory Visit (INDEPENDENT_AMBULATORY_CARE_PROVIDER_SITE_OTHER): Payer: BC Managed Care – PPO

## 2012-01-06 ENCOUNTER — Encounter: Payer: Self-pay | Admitting: *Deleted

## 2012-01-06 DIAGNOSIS — E7800 Pure hypercholesterolemia, unspecified: Secondary | ICD-10-CM

## 2012-01-06 DIAGNOSIS — E739 Lactose intolerance, unspecified: Secondary | ICD-10-CM

## 2012-01-06 DIAGNOSIS — I10 Essential (primary) hypertension: Secondary | ICD-10-CM

## 2012-01-06 LAB — LIPID PANEL
Cholesterol: 266 mg/dL — ABNORMAL HIGH (ref 0–200)
VLDL: 32.4 mg/dL (ref 0.0–40.0)

## 2012-01-06 LAB — COMPREHENSIVE METABOLIC PANEL
CO2: 28 mEq/L (ref 19–32)
Calcium: 9.5 mg/dL (ref 8.4–10.5)
Chloride: 102 mEq/L (ref 96–112)
GFR: 62.84 mL/min (ref >60.00)
Glucose, Bld: 102 mg/dL — ABNORMAL HIGH (ref 70–99)
Sodium: 138 mEq/L (ref 135–145)
Total Bilirubin: 0.6 mg/dL (ref 0.3–1.2)
Total Protein: 7.9 g/dL (ref 6.0–8.3)

## 2012-01-11 ENCOUNTER — Encounter: Payer: Self-pay | Admitting: *Deleted

## 2012-02-10 ENCOUNTER — Ambulatory Visit: Payer: Self-pay | Admitting: Family Medicine

## 2012-02-10 LAB — HM MAMMOGRAPHY: HM Mammogram: NORMAL

## 2012-02-14 ENCOUNTER — Encounter: Payer: Self-pay | Admitting: *Deleted

## 2012-02-14 ENCOUNTER — Encounter: Payer: Self-pay | Admitting: Family Medicine

## 2013-01-11 ENCOUNTER — Ambulatory Visit (INDEPENDENT_AMBULATORY_CARE_PROVIDER_SITE_OTHER): Payer: BC Managed Care – PPO | Admitting: *Deleted

## 2013-01-11 DIAGNOSIS — Z23 Encounter for immunization: Secondary | ICD-10-CM

## 2013-02-12 ENCOUNTER — Ambulatory Visit: Payer: Self-pay | Admitting: Family Medicine

## 2013-02-14 ENCOUNTER — Encounter: Payer: Self-pay | Admitting: Family Medicine

## 2013-05-07 ENCOUNTER — Ambulatory Visit (INDEPENDENT_AMBULATORY_CARE_PROVIDER_SITE_OTHER): Payer: BC Managed Care – PPO | Admitting: Family Medicine

## 2013-05-07 ENCOUNTER — Encounter: Payer: Self-pay | Admitting: Family Medicine

## 2013-05-07 VITALS — BP 108/74 | HR 95 | Temp 98.2°F | Ht 63.0 in | Wt 188.0 lb

## 2013-05-07 DIAGNOSIS — H8309 Labyrinthitis, unspecified ear: Secondary | ICD-10-CM

## 2013-05-07 DIAGNOSIS — R42 Dizziness and giddiness: Secondary | ICD-10-CM

## 2013-05-07 MED ORDER — DIAZEPAM 2 MG PO TABS: 2.0000 mg | ORAL_TABLET | Freq: Three times a day (TID) | ORAL | Status: DC | PRN

## 2013-05-07 MED ORDER — MECLIZINE HCL 25 MG PO TABS: 25.0000 mg | ORAL_TABLET | Freq: Three times a day (TID) | ORAL | Status: DC | PRN

## 2013-05-07 NOTE — Progress Notes (Signed)
Nature conservation officer at [facility] [address] [place] [address] Phone: [phone] Fax: [phone]  Date:  05/07/2013   Name:  Kara Burke   DOB:  [birth year]   MRN:  [id number] Gender: female Age: 52 y.o.  Primary Physician:  Kerby Nora, MD  Evaluating MD: Hannah Beat, MD   Chief Complaint: noise in both ears, dizziness, Nausea and Abdominal Pain   History of Present Illness:  Kara Burke is a 52 y.o. pleasant patient who presents with the following:  Last Wednesday, and felt a very strange feeling in the ears and dizzy. Yesterday three up some. Felt like some fluid in ears. No fever. Sweating a lot.   Has felt off-balance and dizzy. No fever, but has had some sweating.  No syncope. + nausea but no emesis  Patient Active Problem List   Diagnosis Date Noted  . ECZEMA, ATOPIC 08/21/2010  . PRURITUS 08/21/2010  . EDEMA OF EYELID 08/05/2010  . MENOPAUSAL SYNDROME 05/29/2010  . ESSENTIAL HYPERTENSION, BENIGN 12/31/2008  . HYPERCHOLESTEROLEMIA 12/04/2007  . GLUCOSE INTOLERANCE 11/17/2007    Past Medical History  Diagnosis Date  . Pruritus ani   . Herpes zoster without mention of complication   . Pure hypercholesterolemia   . Screening for lipoid disorders   . Routine general medical examination at a health care facility   . 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 Eczema     Past Surgical History  Procedure Laterality Date  . Cholecystectomy      History   Social History  . Marital Status: Married    Spouse Name: N/A    Number of Children: 1  . Years of Education: N/A   Occupational History  . house wife    Social History Main Topics  . Smoking status: Former Games developer  . Smokeless tobacco: Not on file  . Alcohol Use: Yes  . Drug Use: No  . Sexually Active: Not on file   Other Topics Concern  . Not on file   Social History Narrative   Regular exercise-yes, walks stairs daily  20-25 minutes, runs daily   Diet: 3 meals, fruits and vegetables    Family History  Problem Relation Age of Onset  . Diabetes Mother   . Kidney disease Mother   . Cancer Father     throat and lung  . Diabetes Brother     No Known Allergies  Medication list has been reviewed and updated.  Outpatient Prescriptions Prior to Visit  Medication Sig Dispense Refill  . Multiple Vitamin (MULTIVITAMIN) tablet Take 1 tablet by mouth daily.       No facility-administered medications prior to visit.    Review of Systems:  ROS: GEN: Acute illness details above GI: Tolerating PO intake GU: maintaining adequate hydration and urination Pulm: No SOB Interactive and getting along well at home.  Otherwise, ROS is as per the HPI.   Physical Examination: BP 108/74  Pulse 95  Temp(Src) 98.2 F (36.8 C) (Oral)  Ht 5\' 3"  (1.6 m)  Wt 188 lb (85.276 kg)  BMI 33.31 kg/m2  SpO2 98%  Ideal Body Weight: Weight in (lb) to have BMI = 25: 140.8   GEN: WDWN, NAD, Non-toxic, A & O x 3 HEENT: Atraumatic, Normocephalic. Neck supple. No masses, No LAD. Ears and Nose: No external deformity. Fluid B ears. CV: RRR, No M/G/R. No JVD. No thrill. No extra heart sounds. PULM: CTA B, no wheezes, crackles, rhonchi. No retractions. No resp.  distress. No accessory muscle use. EXTR: No c/c/e NEURO Normal gait.  PSYCH: Normally interactive. Conversant. Not depressed or anxious appearing.  Calm demeanor.    Assessment and Plan:  Labyrinthitis, unspecified laterality  Vertigo  + Afrin and sudafed to open eustachian tube  Orders Today:  No orders of the defined types were placed in this encounter.    Updated Medication List: (Includes new medications, updates to list, dose adjustments) Meds ordered this encounter  Medications  . meclizine (ANTIVERT) 25 MG tablet    Sig: Take 1 tablet (25 mg total) by mouth 3 (three) times daily as needed for dizziness.    Dispense:  30 tablet    Refill:  0  .  diazepam (VALIUM) 2 MG tablet    Sig: Take 1 tablet (2 mg total) by mouth every 8 (eight) hours as needed (dizziness).    Dispense:  20 tablet    Refill:  0    Medications Discontinued: There are no discontinued medications.    Signed, Elpidio Galea. Rich Paprocki, MD 05/07/2013 12:28 PM

## 2013-05-07 NOTE — Patient Instructions (Signed)
Sudafed - to help with ear congestion

## 2013-12-25 ENCOUNTER — Telehealth: Payer: Self-pay | Admitting: Family Medicine

## 2013-12-25 ENCOUNTER — Other Ambulatory Visit (INDEPENDENT_AMBULATORY_CARE_PROVIDER_SITE_OTHER): Payer: BC Managed Care – PPO

## 2013-12-25 DIAGNOSIS — I10 Essential (primary) hypertension: Secondary | ICD-10-CM

## 2013-12-25 DIAGNOSIS — E7800 Pure hypercholesterolemia, unspecified: Secondary | ICD-10-CM

## 2013-12-25 LAB — COMPREHENSIVE METABOLIC PANEL
ALT: 31 U/L (ref 0–35)
Albumin: 4.2 g/dL (ref 3.5–5.2)
Alkaline Phosphatase: 94 U/L (ref 39–117)
CO2: 28 mEq/L (ref 19–32)
Glucose, Bld: 113 mg/dL — ABNORMAL HIGH (ref 70–99)
Potassium: 4.5 mEq/L (ref 3.5–5.1)
Sodium: 138 mEq/L (ref 135–145)
Total Protein: 7.6 g/dL (ref 6.0–8.3)

## 2013-12-25 LAB — LIPID PANEL
Cholesterol: 242 mg/dL — ABNORMAL HIGH (ref 0–200)
Total CHOL/HDL Ratio: 6

## 2013-12-25 NOTE — Telephone Encounter (Signed)
Message copied by Excell Seltzer on Tue Dec 25, 2013  7:46 AM ------      Message from: Alvina Chou      Created: Wed Dec 19, 2013 10:38 AM      Regarding: Lab orders for Tuesday, 12.30.14       Patient is scheduled for CPX labs, please order future labs, Thanks , Terri       ------

## 2014-01-01 ENCOUNTER — Ambulatory Visit (INDEPENDENT_AMBULATORY_CARE_PROVIDER_SITE_OTHER): Payer: BC Managed Care – PPO | Admitting: Family Medicine

## 2014-01-01 ENCOUNTER — Encounter: Payer: Self-pay | Admitting: Family Medicine

## 2014-01-01 VITALS — BP 128/72 | HR 73 | Temp 97.8°F | Ht 63.5 in | Wt 197.8 lb

## 2014-01-01 DIAGNOSIS — Z23 Encounter for immunization: Secondary | ICD-10-CM

## 2014-01-01 DIAGNOSIS — Z0000 Encounter for general adult medical examination without abnormal findings: Secondary | ICD-10-CM

## 2014-01-01 DIAGNOSIS — E7800 Pure hypercholesterolemia, unspecified: Secondary | ICD-10-CM

## 2014-01-01 DIAGNOSIS — K219 Gastro-esophageal reflux disease without esophagitis: Secondary | ICD-10-CM

## 2014-01-01 DIAGNOSIS — I10 Essential (primary) hypertension: Secondary | ICD-10-CM

## 2014-01-01 DIAGNOSIS — R7303 Prediabetes: Secondary | ICD-10-CM

## 2014-01-01 DIAGNOSIS — R7309 Other abnormal glucose: Secondary | ICD-10-CM

## 2014-01-01 MED ORDER — ATORVASTATIN CALCIUM 40 MG PO TABS: 40.0000 mg | ORAL_TABLET | Freq: Every day | ORAL | Status: DC

## 2014-01-01 NOTE — Progress Notes (Signed)
Pre-visit discussion using our clinic review tool. No additional management support is needed unless otherwise documented below in the visit note.  

## 2014-01-01 NOTE — Assessment & Plan Note (Signed)
Very poor control.. Get back on track with lifestyle but start cholesterol medication. Re-eval in 3 months.

## 2014-01-01 NOTE — Progress Notes (Addendum)
The patient is here for annual wellness exam and preventative care.   Hypertension: Well controlled on no medications.  BP Readings from Last 3 Encounters:  01/01/14 128/72  05/07/13 108/74  01/05/12 130/70  Using medication without problems or lightheadedness: None  Chest pain with exertion:None,  heartburn occuring daily  Resolves after ranitidine . Edema:None  Short of breath:None  Average home NWG:NFAOZHBPs:rarely , but good. Other issues:   Exercise daily.  Diet moderate.  Menopausal symptoms.. Hot flashes, tolerable. HRT did not help much, so stopped.   Elevated Cholesterol: Trigs elevated, LDL far above goal < 130.  Lab Results  Component Value Date   CHOL 242* 12/25/2013   HDL 42.60 12/25/2013   LDLCALC 125* 01/09/2010   LDLDIRECT 162.4 12/25/2013   TRIG 257.0* 12/25/2013   CHOLHDL 6 12/25/2013  Diet compliance: Moderate  Exercise: Daily walking on treadmill, bike, weights  Other complaints:   Glucose intolerance.worsened in last 2 years. Mother with DM.  Has stopped soda. Has gained back 10 lbs. Wt Readings from Last 3 Encounters:  01/01/14 197 lb 12 oz (89.699 kg)  05/07/13 188 lb (85.276 kg)  01/05/12 188 lb 12.8 oz (85.639 kg)    She is using stool softner for firm stools.  Better now that she has increased fiber and water.  Occ blood in stool with hemmorhoids.  Review of Systems  Constitutional: Negative for fever, fatigue and unexpected weight change.  HENT: Negative for ear pain, congestion, sore throat, sneezing, trouble swallowing and sinus pressure.  Eyes: Negative for pain and itching.  Respiratory: Negative for cough, shortness of breath and wheezing.  Cardiovascular: Negative for chest pain, palpitations and leg swelling.  Gastrointestinal: Negative for nausea, abdominal pain, diarrhea, constipation and blood in stool.  Genitourinary: Negative for dysuria, hematuria, vaginal discharge, difficulty urinating and menstrual problem.  Skin: Negative for rash.   Neurological: Negative for syncope, weakness, light-headedness, numbness and headaches.  Psychiatric/Behavioral: Negative for confusion and dysphoric mood. The patient is not nervous/anxious.  Objective:   Physical Exam  Constitutional: Vital signs are normal. She appears well-developed and well-nourished. She is cooperative. Non-toxic appearance. She does not appear ill. No distress.  HENT:  Head: Normocephalic.  Right Ear: Hearing, tympanic membrane, external ear and ear canal normal.  Left Ear: Hearing, tympanic membrane, external ear and ear canal normal.  Nose: Nose normal.  Eyes: Conjunctivae, EOM and lids are normal. Pupils are equal, round, and reactive to light. No foreign bodies found.  Neck: Trachea normal and normal range of motion. Neck supple. Carotid bruit is not present. No mass and no thyromegaly present.  Cardiovascular: Normal rate, regular rhythm, S1 normal, S2 normal, normal heart sounds and intact distal pulses. Exam reveals no gallop.  No murmur heard.  Pulmonary/Chest: Effort normal and breath sounds normal. No respiratory distress. She has no wheezes. She has no rhonchi. She has no rales.  Abdominal: Soft. Normal appearance and bowel sounds are normal. She exhibits no distension, no fluid wave, no abdominal bruit and no mass. There is no hepatosplenomegaly. There is no tenderness. There is no rebound, no guarding and no CVA tenderness. No hernia.  Genitourinary: Vagina normal and uterus normal. No breast swelling, tenderness, discharge or bleeding. Pelvic exam was performed with patient prone. There is no rash, tenderness or lesion on the right labia. There is no rash, tenderness or lesion on the left labia. Uterus is not enlarged and not tender.  Right adnexum displays no mass, no tenderness and no fullness. Left adnexum  displays no mass, no tenderness and no fullness.  Lymphadenopathy:  She has no cervical adenopathy.  She has no axillary adenopathy.  Neurological:  She is alert. She has normal strength. No cranial nerve deficit or sensory deficit.  Skin: Skin is warm, dry and intact. No rash noted.  Psychiatric: Her speech is normal and behavior is normal. Judgment normal. Her mood appears not anxious. Cognition and memory are normal. She does not exhibit a depressed mood.  Assessment & Plan:   CPX: The patient's preventative maintenance and recommended screening tests for an annual wellness exam were reviewed in full today.  Brought up to date unless services declined.  Counselled on the importance of diet, exercise, and its role in overall health and mortality.  The patient's FH and SH was reviewed, including their home life, tobacco status, and drug and alcohol status.   Vaccines: uptodate with Td, given flu.  Colon: No family history... She still refuses colonoscopy or hemeoccults at this time.  Mammo: 01/2013 DVE/pap: last pap nml 2013, on 3 year pap schedule,  DVE performed today  Former smoker

## 2014-01-01 NOTE — Assessment & Plan Note (Signed)
Well controlled. Continue current medication.  

## 2014-01-01 NOTE — Assessment & Plan Note (Signed)
Trigger avoidance.  Start PPI. Call if not improving as expected.

## 2014-01-01 NOTE — Patient Instructions (Addendum)
Start prilosec 2 tabs 20 mg daily  for 4 weeks. Call if symptoms are not resolved in 2 week for consideration of stronger med or referral to GI. Get back on track with low carb and low cholesterol diet. Increase exercise. Start atorvastatin 40 mg daily. Return for lab recheck in 3 months. Call to schedule mammogram on your own. Consider colonoscopy .Marland Kitchen. Call if interested.

## 2014-01-01 NOTE — Assessment & Plan Note (Signed)
Encouraged exercise, weight loss, healthy eating habits.  Check A1C with next lab draw.

## 2014-01-25 ENCOUNTER — Telehealth: Payer: Self-pay | Admitting: Family Medicine

## 2014-01-30 NOTE — Telephone Encounter (Signed)
Relevant patient education mailed to patient.  

## 2014-02-18 ENCOUNTER — Ambulatory Visit: Payer: Self-pay | Admitting: Family Medicine

## 2014-02-20 ENCOUNTER — Encounter: Payer: Self-pay | Admitting: Family Medicine

## 2014-04-04 ENCOUNTER — Encounter: Payer: Self-pay | Admitting: *Deleted

## 2014-04-04 ENCOUNTER — Other Ambulatory Visit (INDEPENDENT_AMBULATORY_CARE_PROVIDER_SITE_OTHER): Payer: BC Managed Care – PPO

## 2014-04-04 DIAGNOSIS — I10 Essential (primary) hypertension: Secondary | ICD-10-CM

## 2014-04-04 DIAGNOSIS — R7309 Other abnormal glucose: Secondary | ICD-10-CM

## 2014-04-04 DIAGNOSIS — R7303 Prediabetes: Secondary | ICD-10-CM

## 2014-04-04 DIAGNOSIS — E7800 Pure hypercholesterolemia, unspecified: Secondary | ICD-10-CM

## 2014-04-04 LAB — LIPID PANEL
Cholesterol: 170 mg/dL (ref 0–200)
HDL: 47.5 mg/dL (ref >39.00)
LDL Cholesterol: 87 mg/dL (ref 0–99)
Total CHOL/HDL Ratio: 4
Triglycerides: 180 mg/dL — ABNORMAL HIGH (ref 0.0–149.0)
VLDL: 36 mg/dL (ref 0.0–40.0)

## 2014-04-04 LAB — COMPREHENSIVE METABOLIC PANEL
ALBUMIN: 4.2 g/dL (ref 3.5–5.2)
ALT: 28 U/L (ref 0–35)
AST: 19 U/L (ref 0–37)
Alkaline Phosphatase: 97 U/L (ref 39–117)
BUN: 13 mg/dL (ref 6–23)
CALCIUM: 9.6 mg/dL (ref 8.4–10.5)
CHLORIDE: 102 meq/L (ref 96–112)
CO2: 30 meq/L (ref 19–32)
Creatinine, Ser: 0.8 mg/dL (ref 0.4–1.2)
GFR: 77.42 mL/min (ref >60.00)
Glucose, Bld: 111 mg/dL — ABNORMAL HIGH (ref 70–99)
POTASSIUM: 4.3 meq/L (ref 3.5–5.1)
Sodium: 138 mEq/L (ref 135–145)
Total Bilirubin: 0.9 mg/dL (ref 0.3–1.2)
Total Protein: 7.7 g/dL (ref 6.0–8.3)

## 2014-04-04 LAB — HEMOGLOBIN A1C: HEMOGLOBIN A1C: 5.7 % (ref 4.6–6.5)

## 2014-12-04 ENCOUNTER — Other Ambulatory Visit: Payer: Self-pay | Admitting: Family Medicine

## 2014-12-30 ENCOUNTER — Other Ambulatory Visit (INDEPENDENT_AMBULATORY_CARE_PROVIDER_SITE_OTHER): Payer: BLUE CROSS/BLUE SHIELD

## 2014-12-30 ENCOUNTER — Telehealth: Payer: Self-pay | Admitting: Family Medicine

## 2014-12-30 DIAGNOSIS — E7800 Pure hypercholesterolemia, unspecified: Secondary | ICD-10-CM

## 2014-12-30 DIAGNOSIS — R7309 Other abnormal glucose: Secondary | ICD-10-CM

## 2014-12-30 DIAGNOSIS — R7303 Prediabetes: Secondary | ICD-10-CM

## 2014-12-30 LAB — LIPID PANEL
CHOLESTEROL: 160 mg/dL (ref 0–200)
HDL: 43.3 mg/dL (ref >39.00)
NonHDL: 116.7
Total CHOL/HDL Ratio: 4
Triglycerides: 230 mg/dL — ABNORMAL HIGH (ref 0.0–149.0)
VLDL: 46 mg/dL — AB (ref 0.0–40.0)

## 2014-12-30 LAB — COMPREHENSIVE METABOLIC PANEL
ALBUMIN: 4.3 g/dL (ref 3.5–5.2)
ALT: 25 U/L (ref 0–35)
AST: 16 U/L (ref 0–37)
Alkaline Phosphatase: 102 U/L (ref 39–117)
BUN: 17 mg/dL (ref 6–23)
CO2: 27 mEq/L (ref 19–32)
Calcium: 9.3 mg/dL (ref 8.4–10.5)
Chloride: 104 mEq/L (ref 96–112)
Creatinine, Ser: 0.7 mg/dL (ref 0.4–1.2)
GFR: 92.67 mL/min (ref >60.00)
Glucose, Bld: 119 mg/dL — ABNORMAL HIGH (ref 70–99)
Potassium: 4.5 mEq/L (ref 3.5–5.1)
Sodium: 139 mEq/L (ref 135–145)
Total Bilirubin: 0.5 mg/dL (ref 0.2–1.2)
Total Protein: 7.6 g/dL (ref 6.0–8.3)

## 2014-12-30 LAB — HEMOGLOBIN A1C: Hgb A1c MFr Bld: 6 % (ref 4.6–6.5)

## 2014-12-30 LAB — LDL CHOLESTEROL, DIRECT: Direct LDL: 93.6 mg/dL

## 2014-12-30 NOTE — Telephone Encounter (Signed)
-----   Message from Alvina Chou sent at 12/24/2014 10:30 AM EST ----- Regarding: Lab orders for Monday, 1.4.16 Patient is scheduled for CPX labs, please order future labs, Thanks , Camelia Eng

## 2015-01-03 ENCOUNTER — Other Ambulatory Visit (HOSPITAL_COMMUNITY)
Admission: RE | Admit: 2015-01-03 | Discharge: 2015-01-03 | Disposition: A | Discharge status: Home / Self Care | Payer: BLUE CROSS/BLUE SHIELD | Source: Ambulatory Visit | Attending: Family Medicine | Admitting: Family Medicine

## 2015-01-03 ENCOUNTER — Encounter: Payer: Self-pay | Admitting: Family Medicine

## 2015-01-03 ENCOUNTER — Ambulatory Visit (INDEPENDENT_AMBULATORY_CARE_PROVIDER_SITE_OTHER): Payer: BLUE CROSS/BLUE SHIELD | Admitting: Family Medicine

## 2015-01-03 VITALS — BP 110/60 | HR 77 | Temp 99.0°F | Ht 63.5 in | Wt 212.8 lb

## 2015-01-03 DIAGNOSIS — N950 Postmenopausal bleeding: Secondary | ICD-10-CM | POA: Insufficient documentation

## 2015-01-03 DIAGNOSIS — I10 Essential (primary) hypertension: Secondary | ICD-10-CM

## 2015-01-03 DIAGNOSIS — Z124 Encounter for screening for malignant neoplasm of cervix: Secondary | ICD-10-CM

## 2015-01-03 DIAGNOSIS — Z01411 Encounter for gynecological examination (general) (routine) with abnormal findings: Secondary | ICD-10-CM | POA: Insufficient documentation

## 2015-01-03 DIAGNOSIS — Z1151 Encounter for screening for human papillomavirus (HPV): Secondary | ICD-10-CM | POA: Insufficient documentation

## 2015-01-03 DIAGNOSIS — Z0000 Encounter for general adult medical examination without abnormal findings: Secondary | ICD-10-CM

## 2015-01-03 DIAGNOSIS — E7800 Pure hypercholesterolemia, unspecified: Secondary | ICD-10-CM

## 2015-01-03 DIAGNOSIS — E739 Lactose intolerance, unspecified: Secondary | ICD-10-CM

## 2015-01-03 DIAGNOSIS — Z23 Encounter for immunization: Secondary | ICD-10-CM

## 2015-01-03 NOTE — Progress Notes (Signed)
The patient is here for annual wellness exam and preventative care.     She had been 3-3.5 years with out menses.   She has mild belly pain. Nipples tender.  Occured after  sex with husband.  Lasted 5 days, light, still some spotting.  Otherwise she feels well.  Hypertension: Well controlled on no medications.  BP Readings from Last 3 Encounters:  01/03/15 110/60  01/01/14 128/72  05/07/13 108/74  None  Chest pain with exertion:none Edema:None  Short of breath:None  Average home ZHY:[id number]:rarely , but good. Other issues:  Exercise daily. Diet moderate. Eating out some in holidays.  Menopausal symptoms.. Hot flashes, tolerable. HRT did not help much, so stopped.   Elevated Cholesterol: Trigs elevated, LDL  at  goal < 130 on lipitor 40 mg daily Lab Results  Component Value Date   CHOL 160 12/30/2014   HDL 43.30 12/30/2014   LDLCALC 87 04/04/2014   LDLDIRECT 93.6 12/30/2014   TRIG 230.0* 12/30/2014   CHOLHDL 4 12/30/2014  No myalgia.  Wt Readings from Last 3 Encounters:  01/03/15 212 lb 12 oz (96.503 kg)  01/01/14 197 lb 12 oz (89.699 kg)  05/07/13 188 lb (85.276 kg)    Review of Systems  Constitutional: Negative for fever, fatigue and unexpected weight change.  HENT: Negative for ear pain, congestion, sore throat, sneezing, trouble swallowing and sinus pressure.  Eyes: Negative for pain and itching.  Respiratory: Negative for cough, shortness of breath and wheezing.  Cardiovascular: Negative for chest pain, palpitations and leg swelling.  Gastrointestinal: Negative for nausea, abdominal pain, diarrhea, constipation and blood in stool.  Genitourinary: Negative for dysuria, hematuria, vaginal discharge, difficulty urinating and menstrual problem.  Skin: Negative for rash.  Neurological: Negative for syncope, weakness, light-headedness, numbness and headaches.  Psychiatric/Behavioral: Negative for confusion and dysphoric mood. The patient is not  nervous/anxious.  Objective:   Physical Exam  Constitutional: Vital signs are normal. She appears well-developed and well-nourished. She is cooperative. Non-toxic appearance. She does not appear ill. No distress.  HENT:  Head: Normocephalic.  Right Ear: Hearing, tympanic membrane, external ear and ear canal normal.  Left Ear: Hearing, tympanic membrane, external ear and ear canal normal.  Nose: Nose normal.  Eyes: Conjunctivae, EOM and lids are normal. Pupils are equal, round, and reactive to light. No foreign bodies found.  Neck: Trachea normal and normal range of motion. Neck supple. Carotid bruit is not present. No mass and no thyromegaly present.  Cardiovascular: Normal rate, regular rhythm, S1 normal, S2 normal, normal heart sounds and intact distal pulses. Exam reveals no gallop.  No murmur heard.  Pulmonary/Chest: Effort normal and breath sounds normal. No respiratory distress. She has no wheezes. She has no rhonchi. She has no rales.  Abdominal: Soft. Normal appearance and bowel sounds are normal. She exhibits no distension, no fluid wave, no abdominal bruit and no mass. There is no hepatosplenomegaly. There is no tenderness. There is no rebound, no guarding and no CVA tenderness. No hernia.  Genitourinary: Vagina normal and uterus normal. No breast swelling, tenderness, discharge or bleeding. Pelvic exam was performed with patient prone. There is no rash, tenderness or lesion on the right labia. There is no rash, tenderness or lesion on the left labia. Uterus is not enlarged and not tender. Right adnexum displays no mass, no tenderness and no fullness. Left adnexum displays no mass, no tenderness and no fullness.  Lymphadenopathy:  She has no cervical adenopathy.  She has no axillary adenopathy.  Neurological: She  is alert. She has normal strength. No cranial nerve deficit or sensory deficit.  Skin: Skin is warm, dry and intact. No rash noted.  Psychiatric: Her speech is  normal and behavior is normal. Judgment normal. Her mood appears not anxious. Cognition and memory are normal. She does not exhibit a depressed mood.  Assessment & Plan:   CPX: The patient's preventative maintenance and recommended screening tests for an annual wellness exam were reviewed in full today.  Brought up to date unless services declined.  Counselled on the importance of diet, exercise, and its role in overall health and mortality.  The patient's FH and SH was reviewed, including their home life, tobacco status, and drug and alcohol status.   Vaccines: uptodate with Td, given flu TODAY. Colon: No family history... She still refuses colonoscopy or hemeoccults at this time.  Mammo: 01/2014 DVE/pap: last pap nml 2013, on 3 year pap schedule, due with DVE today  Former smoker

## 2015-01-03 NOTE — Assessment & Plan Note (Signed)
Work on low carb diet. 

## 2015-01-03 NOTE — Patient Instructions (Addendum)
Keep working on regular exercise, healthy low carb eating and weight loss. Schedule mammogram on your own in Feb 2016. If you are interested in screening for colon cancer with stoll test or colonoscopy let us know.

## 2015-01-03 NOTE — Assessment & Plan Note (Addendum)
Likely due to perimenopausal state. Has other hormonal symptoms like breast tenderness like she used to have with ovulation/ menses. Normal exam. Will send for pap. Consider US if continuing to bleed.

## 2015-01-03 NOTE — Addendum Note (Signed)
Addended by: Damita LackLORING, DONNA S on: 01/03/2015 02:51 PM   Modules accepted: Orders

## 2015-01-03 NOTE — Progress Notes (Signed)
Pre visit review using our clinic review tool, if applicable. No additional management support is needed unless otherwise documented below in the visit note. 

## 2015-01-03 NOTE — Assessment & Plan Note (Signed)
Well controlled. Continue current medication.  

## 2015-01-03 NOTE — Assessment & Plan Note (Signed)
At goal on current med, Trig still high.

## 2015-01-06 LAB — CYTOLOGY - PAP

## 2015-01-09 ENCOUNTER — Encounter: Payer: Self-pay | Admitting: *Deleted

## 2015-02-19 ENCOUNTER — Ambulatory Visit: Payer: Self-pay | Admitting: Family Medicine

## 2015-02-21 ENCOUNTER — Encounter: Payer: Self-pay | Admitting: Family Medicine

## 2015-02-22 ENCOUNTER — Other Ambulatory Visit: Payer: Self-pay | Admitting: Family Medicine

## 2015-12-30 ENCOUNTER — Other Ambulatory Visit: Payer: Self-pay | Admitting: Family Medicine

## 2015-12-30 ENCOUNTER — Other Ambulatory Visit (INDEPENDENT_AMBULATORY_CARE_PROVIDER_SITE_OTHER): Payer: BLUE CROSS/BLUE SHIELD

## 2015-12-30 ENCOUNTER — Telehealth: Payer: Self-pay | Admitting: Family Medicine

## 2015-12-30 DIAGNOSIS — E7800 Pure hypercholesterolemia, unspecified: Secondary | ICD-10-CM | POA: Diagnosis not present

## 2015-12-30 DIAGNOSIS — Z1159 Encounter for screening for other viral diseases: Secondary | ICD-10-CM

## 2015-12-30 DIAGNOSIS — R7303 Prediabetes: Secondary | ICD-10-CM

## 2015-12-30 LAB — LIPID PANEL
CHOLESTEROL: 148 mg/dL (ref 0–200)
HDL: 57 mg/dL (ref >39.00)
LDL CALC: 75 mg/dL (ref 0–99)
NonHDL: 91.42
Total CHOL/HDL Ratio: 3
Triglycerides: 81 mg/dL (ref 0.0–149.0)
VLDL: 16.2 mg/dL (ref 0.0–40.0)

## 2015-12-30 LAB — HEMOGLOBIN A1C: HEMOGLOBIN A1C: 5.3 % (ref 4.6–6.5)

## 2015-12-30 LAB — COMPREHENSIVE METABOLIC PANEL
ALBUMIN: 4.3 g/dL (ref 3.5–5.2)
ALT: 24 U/L (ref 0–35)
AST: 19 U/L (ref 0–37)
Alkaline Phosphatase: 105 U/L (ref 39–117)
BUN: 11 mg/dL (ref 6–23)
CHLORIDE: 102 meq/L (ref 96–112)
CO2: 32 meq/L (ref 19–32)
Calcium: 9.9 mg/dL (ref 8.4–10.5)
Creatinine, Ser: 0.68 mg/dL (ref 0.40–1.20)
GFR: 95.46 mL/min (ref >60.00)
Glucose, Bld: 97 mg/dL (ref 70–99)
POTASSIUM: 4.8 meq/L (ref 3.5–5.1)
SODIUM: 140 meq/L (ref 135–145)
Total Bilirubin: 0.5 mg/dL (ref 0.2–1.2)
Total Protein: 7.3 g/dL (ref 6.0–8.3)

## 2015-12-30 NOTE — Telephone Encounter (Signed)
-----   Message from Baldomero LamyNatasha C Chavers sent at 12/23/2015  9:36 AM EST ----- Regarding: Cpx labs Tues 1/3, need orders. Thanks:-) Please order  future cpx labs for pt's upcoming lab appt. Thanks Rodney Boozeasha

## 2015-12-31 LAB — HEPATITIS C ANTIBODY: HCV Ab: NEGATIVE

## 2016-01-06 ENCOUNTER — Encounter: Payer: Self-pay | Admitting: Family Medicine

## 2016-01-06 ENCOUNTER — Ambulatory Visit (INDEPENDENT_AMBULATORY_CARE_PROVIDER_SITE_OTHER): Payer: BLUE CROSS/BLUE SHIELD | Admitting: Family Medicine

## 2016-01-06 VITALS — BP 140/72 | HR 71 | Temp 98.8°F | Ht 63.0 in | Wt 170.5 lb

## 2016-01-06 DIAGNOSIS — E7800 Pure hypercholesterolemia, unspecified: Secondary | ICD-10-CM

## 2016-01-06 DIAGNOSIS — Z0000 Encounter for general adult medical examination without abnormal findings: Secondary | ICD-10-CM | POA: Diagnosis not present

## 2016-01-06 DIAGNOSIS — Z23 Encounter for immunization: Secondary | ICD-10-CM

## 2016-01-06 DIAGNOSIS — I10 Essential (primary) hypertension: Secondary | ICD-10-CM

## 2016-01-06 NOTE — Assessment & Plan Note (Signed)
Decrease atorvastatin to 3 times a week. Return  For lab only visit in 3 months, fasting for chol check on lower dose of medication.

## 2016-01-06 NOTE — Patient Instructions (Addendum)
Follow BP at home to make sure BP well controlled less than 140/90 at home.  Keep up the great work with healthy lifestyle.   Decrease atorvastatin to 3 times a week. Return  For lab only visit in 3 months, fasting for chol check on lower dose of medication.  Schedule mammogram on your own in 01/2016. Think about colonoscopy for colon cancer screen.

## 2016-01-06 NOTE — Progress Notes (Signed)
The patient is here for annual wellness exam and preventative care.   Hypertension: Well controlled on no medications.  BP Readings from Last 3 Encounters:  01/06/16 140/72  01/03/15 110/60  01/01/14 128/72  Lightheadedness: None Chest pain with exertion:none Edema:None  Short of breath:None  Average home [id number]:rarely, but good. Other issues:   Menopausal symptoms.. Hot flashes, tolerable. HRT did not help much, so stopped.  Improved overall.  Elevated Cholesterol: Trigs elevated, LDL at goal < 130 on lipitor 40 mg daily Lab Results  Component Value Date   CHOL 148 12/30/2015   HDL 57.00 12/30/2015   LDLCALC 75 12/30/2015   LDLDIRECT 93.6 12/30/2014   TRIG 81.0 12/30/2015   CHOLHDL 3 12/30/2015   No myalgia. She has been eating very healthy. She has used herbal life shake. Exercise: daily on treadmill.  She has lost 40 lbs! Wt Readings from Last 3 Encounters:  01/06/16 170 lb 8 oz (77.338 kg)  01/03/15 212 lb 12 oz (96.503 kg)  01/01/14 197 lb 12 oz (89.699 kg)  Body mass index is 30.21 kg/(m^2).  Prediabetes: resolved. Hga1C decrease to normal 5.3/.  Review of Systems  Constitutional: Negative for fever, fatigue and unexpected weight change.  HENT: Negative for ear pain, congestion, sore throat, sneezing, trouble swallowing and sinus pressure.  Eyes: Negative for pain and itching.  Respiratory: Negative for cough, shortness of breath and wheezing.  Cardiovascular: Negative for chest pain, palpitations and leg swelling.  Gastrointestinal: Negative for nausea, abdominal pain, diarrhea, constipation and blood in stool.  Genitourinary: Negative for dysuria, hematuria, vaginal discharge, difficulty urinating and menstrual problem.  Skin: Negative for rash.  Neurological: Negative for syncope, weakness, light-headedness, numbness and headaches.  Psychiatric/Behavioral: Negative for confusion and dysphoric mood. The patient is not nervous/anxious.   Objective:   Physical Exam  Constitutional: Vital signs are normal. She appears well-developed and well-nourished. She is cooperative. Non-toxic appearance. She does not appear ill. No distress.  HENT:  Head: Normocephalic.  Right Ear: Hearing, tympanic membrane, external ear and ear canal normal.  Left Ear: Hearing, tympanic membrane, external ear and ear canal normal.  Nose: Nose normal.  Eyes: Conjunctivae, EOM and lids are normal. Pupils are equal, round, and reactive to light. No foreign bodies found.  Neck: Trachea normal and normal range of motion. Neck supple. Carotid bruit is not present. No mass and no thyromegaly present.  Cardiovascular: Normal rate, regular rhythm, S1 normal, S2 normal, normal heart sounds and intact distal pulses. Exam reveals no gallop.  No murmur heard.  Pulmonary/Chest: Effort normal and breath sounds normal. No respiratory distress. She has no wheezes. She has no rhonchi. She has no rales.  Abdominal: Soft. Normal appearance and bowel sounds are normal. She exhibits no distension, no fluid wave, no abdominal bruit and no mass. There is no hepatosplenomegaly. There is no tenderness. There is no rebound, no guarding and no CVA tenderness. No hernia.  Genitourinary: Vagina normal and uterus normal. No breast swelling, tenderness, discharge or bleeding. Pelvic exam was performed with patient prone. There is no rash, tenderness or lesion on the right labia. There is no rash, tenderness or lesion on the left labia. Uterus is not enlarged and not tender. Right adnexum displays no mass, no tenderness and no fullness. Left adnexum displays no mass, no tenderness and no fullness.  Lymphadenopathy:  She has no cervical adenopathy.  She has no axillary adenopathy.  Neurological: She is alert. She has normal strength. No cranial nerve deficit or sensory deficit.  Skin: Skin is warm, dry and intact. No rash noted.  Psychiatric: Her speech is normal and  behavior is normal. Judgment normal. Her mood appears not anxious. Cognition and memory are normal. She does not exhibit a depressed mood.  Assessment & Plan:   CPX: The patient's preventative maintenance and recommended screening tests for an annual wellness exam were reviewed in full today.  Brought up to date unless services declined.  Counselled on the importance of diet, exercise, and its role in overall health and mortality.  The patient's FH and SH was reviewed, including their home life, tobacco status, and drug and alcohol status.   Vaccines: uptodate with Td, given flu TODAY. Colon: No family history... She still refuses colonoscopy or hemeoccults at this time.  Mammo: 01/2015 nml DVE/pap: last pap nml 2016, on 3 year pap schedule. Neg for HPV.  Former smoker Hep C: neg  HIV: refused

## 2016-01-06 NOTE — Progress Notes (Signed)
Pre visit review using our clinic review tool, if applicable. No additional management support is needed unless otherwise documented below in the visit note. 

## 2016-01-06 NOTE — Assessment & Plan Note (Signed)
Nervous with exam. Follow at home. Encouraged  continued exercise, weight loss, healthy eating habits.

## 2016-01-31 ENCOUNTER — Other Ambulatory Visit: Payer: Self-pay | Admitting: Family Medicine

## 2016-04-05 ENCOUNTER — Other Ambulatory Visit (INDEPENDENT_AMBULATORY_CARE_PROVIDER_SITE_OTHER): Payer: BLUE CROSS/BLUE SHIELD

## 2016-04-05 ENCOUNTER — Telehealth: Payer: Self-pay | Admitting: Family Medicine

## 2016-04-05 DIAGNOSIS — E7800 Pure hypercholesterolemia, unspecified: Secondary | ICD-10-CM | POA: Diagnosis not present

## 2016-04-05 LAB — LIPID PANEL
CHOL/HDL RATIO: 3
Cholesterol: 160 mg/dL (ref 0–200)
HDL: 57.9 mg/dL (ref >39.00)
LDL CALC: 79 mg/dL (ref 0–99)
NonHDL: 101.75
TRIGLYCERIDES: 114 mg/dL (ref 0.0–149.0)
VLDL: 22.8 mg/dL (ref 0.0–40.0)

## 2016-04-05 NOTE — Telephone Encounter (Signed)
-----   Message from Alvina Chouerri J Walsh sent at 03/26/2016 11:14 AM EDT ----- Regarding: lab orders for Monday, April 10,17 Lab orders for a 3 month follow up appt.

## 2016-04-06 ENCOUNTER — Encounter: Payer: Self-pay | Admitting: *Deleted

## 2016-12-26 ENCOUNTER — Other Ambulatory Visit: Payer: Self-pay | Admitting: Family Medicine

## 2016-12-28 ENCOUNTER — Telehealth: Payer: Self-pay | Admitting: Family Medicine

## 2016-12-28 DIAGNOSIS — I10 Essential (primary) hypertension: Secondary | ICD-10-CM

## 2016-12-28 DIAGNOSIS — E7800 Pure hypercholesterolemia, unspecified: Secondary | ICD-10-CM

## 2016-12-28 NOTE — Telephone Encounter (Signed)
Labs ordered.

## 2017-01-04 ENCOUNTER — Other Ambulatory Visit (INDEPENDENT_AMBULATORY_CARE_PROVIDER_SITE_OTHER): Payer: BLUE CROSS/BLUE SHIELD

## 2017-01-04 DIAGNOSIS — E7800 Pure hypercholesterolemia, unspecified: Secondary | ICD-10-CM

## 2017-01-04 LAB — COMPREHENSIVE METABOLIC PANEL
ALK PHOS: 85 U/L (ref 39–117)
ALT: 21 U/L (ref 0–35)
AST: 17 U/L (ref 0–37)
Albumin: 4.7 g/dL (ref 3.5–5.2)
BUN: 17 mg/dL (ref 6–23)
CHLORIDE: 101 meq/L (ref 96–112)
CO2: 30 meq/L (ref 19–32)
Calcium: 10 mg/dL (ref 8.4–10.5)
Creatinine, Ser: 0.76 mg/dL (ref 0.40–1.20)
GFR: 83.65 mL/min (ref >60.00)
GLUCOSE: 103 mg/dL — AB (ref 70–99)
POTASSIUM: 4 meq/L (ref 3.5–5.1)
SODIUM: 139 meq/L (ref 135–145)
Total Bilirubin: 0.8 mg/dL (ref 0.2–1.2)
Total Protein: 7.8 g/dL (ref 6.0–8.3)

## 2017-01-04 LAB — LIPID PANEL
CHOL/HDL RATIO: 3
Cholesterol: 173 mg/dL (ref 0–200)
HDL: 66 mg/dL (ref >39.00)
LDL CALC: 79 mg/dL (ref 0–99)
NONHDL: 107.26
Triglycerides: 140 mg/dL (ref 0.0–149.0)
VLDL: 28 mg/dL (ref 0.0–40.0)

## 2017-01-07 ENCOUNTER — Encounter: Payer: Self-pay | Admitting: Family Medicine

## 2017-01-07 ENCOUNTER — Ambulatory Visit (INDEPENDENT_AMBULATORY_CARE_PROVIDER_SITE_OTHER): Payer: BLUE CROSS/BLUE SHIELD | Admitting: Family Medicine

## 2017-01-07 VITALS — BP 120/60 | HR 74 | Temp 98.7°F | Ht 63.0 in | Wt 177.5 lb

## 2017-01-07 DIAGNOSIS — Z0000 Encounter for general adult medical examination without abnormal findings: Secondary | ICD-10-CM | POA: Diagnosis not present

## 2017-01-07 DIAGNOSIS — I10 Essential (primary) hypertension: Secondary | ICD-10-CM

## 2017-01-07 DIAGNOSIS — Z23 Encounter for immunization: Secondary | ICD-10-CM | POA: Diagnosis not present

## 2017-01-07 DIAGNOSIS — R7303 Prediabetes: Secondary | ICD-10-CM | POA: Diagnosis not present

## 2017-01-07 DIAGNOSIS — E7800 Pure hypercholesterolemia, unspecified: Secondary | ICD-10-CM

## 2017-01-07 MED ORDER — ATORVASTATIN CALCIUM 40 MG PO TABS: 40.0000 mg | ORAL_TABLET | Freq: Every day | ORAL | 0 refills | Status: DC

## 2017-01-07 NOTE — Addendum Note (Signed)
Addended by: Damita LackLORING, Leahann Lempke S on: 01/07/2017 02:47 PM   Modules accepted: Orders

## 2017-01-07 NOTE — Assessment & Plan Note (Signed)
Well controlled. Continue current medication.  

## 2017-01-07 NOTE — Assessment & Plan Note (Signed)
Great control on moderate dose statin.

## 2017-01-07 NOTE — Patient Instructions (Addendum)
Call to schedule  mammogram on your own. Keep working on healthy lifestyle.

## 2017-01-07 NOTE — Progress Notes (Signed)
Pre visit review using our clinic review tool, if applicable. No additional management support is needed unless otherwise documented below in the visit note. 

## 2017-01-07 NOTE — Addendum Note (Signed)
Addended by: Damita LackLORING, DONNA S on: 01/07/2017 03:53 PM   Modules accepted: Orders

## 2017-01-07 NOTE — Progress Notes (Signed)
Subjective:    Patient ID: Kara Burke, female    DOB: 1961/10/06, 56 y.o.   MRN: [id number]  HPI The patient is here for annual wellness exam and preventative care.    Hypertension:    Good control on no meds. BP Readings from Last 3 Encounters:  01/07/17 120/60  01/06/16 140/72  01/03/15 110/60  Using medication without problems or lightheadedness:  None Chest pain with exertion:None Edema:None Short of breath:None Average home BPs: 120/70s Other issues:  Elevated Cholesterol: Excellent control on moderate dose statin. Lab Results  Component Value Date   CHOL 173 01/04/2017   HDL 66.00 01/04/2017   LDLCALC 79 01/04/2017   LDLDIRECT 93.6 12/30/2014   TRIG 140.0 01/04/2017   CHOLHDL 3 01/04/2017  Using medications without problems: Muscle aches:  Diet compliance: Good, low fat Exercise: walk daily 6 miles, fit bit Other complaints:  Wt Readings from Last 3 Encounters:  01/07/17 177 lb 8 oz (80.5 kg)  01/06/16 170 lb 8 oz (77.3 kg)  01/03/15 212 lb 12 oz (96.5 kg)   Prediabetes:  Mild. CBG 103 this year. Lab Results  Component Value Date   HGBA1C 5.3 12/30/2015   Body mass index is 31.44 kg/m.   Still with nightly hot flash... Not improving much.   Social History /Family History/Past Medical History reviewed and updated if needed.  Review of Systems  Constitutional: Negative for fatigue and fever.  HENT: Negative for congestion.   Eyes: Negative for pain.  Respiratory: Negative for cough and shortness of breath.   Cardiovascular: Negative for chest pain, palpitations and leg swelling.  Gastrointestinal: Negative for abdominal pain.  Genitourinary: Negative for dysuria and vaginal bleeding.  Musculoskeletal: Positive for back pain.  Neurological: Negative for syncope, light-headedness and headaches.  Psychiatric/Behavioral: Negative for dysphoric mood.       Objective:   Physical Exam  Constitutional: Vital signs are normal. She appears  well-developed and well-nourished. She is cooperative.  Non-toxic appearance. She does not appear ill. No distress.  HENT:  Head: Normocephalic.  Right Ear: Hearing, tympanic membrane, external ear and ear canal normal.  Left Ear: Hearing, tympanic membrane, external ear and ear canal normal.  Nose: Nose normal.  Eyes: Conjunctivae, EOM and lids are normal. Pupils are equal, round, and reactive to light. Lids are everted and swept, no foreign bodies found.  Neck: Trachea normal and normal range of motion. Neck supple. Carotid bruit is not present. No thyroid mass and no thyromegaly present.  Cardiovascular: Normal rate, regular rhythm, S1 normal, S2 normal, normal heart sounds and intact distal pulses.  Exam reveals no gallop.   No murmur heard. Pulmonary/Chest: Effort normal and breath sounds normal. No respiratory distress. She has no wheezes. She has no rhonchi. She has no rales.  Abdominal: Soft. Normal appearance and bowel sounds are normal. She exhibits no distension, no fluid wave, no abdominal bruit and no mass. There is no hepatosplenomegaly. There is no tenderness. There is no rebound, no guarding and no CVA tenderness. No hernia.  Lymphadenopathy:    She has no cervical adenopathy.    She has no axillary adenopathy.  Neurological: She is alert. She has normal strength. No cranial nerve deficit or sensory deficit.  Skin: Skin is warm, dry and intact. No rash noted.  Psychiatric: Her speech is normal and behavior is normal. Judgment normal. Her mood appears not anxious. Cognition and memory are normal. She does not exhibit a depressed mood.  Assessment & Plan:  The patient's preventative maintenance and recommended screening tests for an annual wellness exam were reviewed in full today. Brought up to date unless services declined.  Counselled on the importance of diet, exercise, and its role in overall health and mortality. The patient's FH and SH was reviewed, including  their home life, tobacco status, and drug and alcohol status.   Vaccines: uptodate except due for flu.. Given today. Pap/DVE:  Last pap 2016.Marland Kitchen. Plan q 5 years given neg HPV. No family history of ovarian cancer. Mammo: 01/2015, repeat q 2 years, Bone Density:low risk Colon:  No family history... Refused at this time. Smoking Status: remote former, assymptomatic  Hep C:  done  HIV screen:   refused

## 2017-02-08 ENCOUNTER — Other Ambulatory Visit: Payer: Self-pay | Admitting: Family Medicine

## 2017-02-08 DIAGNOSIS — Z1231 Encounter for screening mammogram for malignant neoplasm of breast: Secondary | ICD-10-CM

## 2017-02-15 ENCOUNTER — Other Ambulatory Visit: Payer: Self-pay | Admitting: Family Medicine

## 2017-02-15 ENCOUNTER — Telehealth: Payer: Self-pay | Admitting: Family Medicine

## 2017-02-15 NOTE — Telephone Encounter (Signed)
Pt's spouse called, very upset.  He states that every time he comes into the office for a CPE either he or his wife or getting billed and that is not correct.  He says that after each visit he calls our office, someone will correct the coding or billing and the bill gets "taken care of".  He is now saying that he recently discussed his concerns with Dr. Ermalene SearingBedsole and that he is "about to find somewhere else to go if we can't do our job correctly the first time".  Reviewed the billing and contacted Rose B for her evaluation as well.  Best number to call spouse regarding the 01/07/17 DOS is [phone]

## 2017-02-15 NOTE — Telephone Encounter (Signed)
I have reviewed the account and according to the EOB $237.97 went to deductible and it is coded correctly.  From reviewing the pt's response history it appears she has a high end deductible plan.  I do notice that in the past insurance has paid, but her plan could have changed.  If she feels insurance should have covered this then she will need to contact the number on her card.  Please notify the patient.  She does not have a DPR on file authorizing us to speak w/spouse so I would speak to her.

## 2017-03-09 ENCOUNTER — Ambulatory Visit
Admission: RE | Admit: 2017-03-09 | Discharge: 2017-03-09 | Disposition: A | Discharge status: Home / Self Care | Payer: BLUE CROSS/BLUE SHIELD | Source: Ambulatory Visit | Attending: Family Medicine | Admitting: Family Medicine

## 2017-03-09 DIAGNOSIS — Z1231 Encounter for screening mammogram for malignant neoplasm of breast: Secondary | ICD-10-CM | POA: Diagnosis not present

## 2017-12-30 ENCOUNTER — Telehealth: Payer: Self-pay | Admitting: Family Medicine

## 2017-12-30 DIAGNOSIS — E7800 Pure hypercholesterolemia, unspecified: Secondary | ICD-10-CM

## 2017-12-30 NOTE — Telephone Encounter (Signed)
-----   Message from Alvina Chouerri J Walsh sent at 12/28/2017  9:44 AM EST ----- Regarding: Lab orders for Monday, 1.7.19 Patient is scheduled for CPX labs, please order future labs, Thanks , Camelia Engerri

## 2018-01-03 ENCOUNTER — Other Ambulatory Visit (INDEPENDENT_AMBULATORY_CARE_PROVIDER_SITE_OTHER): Payer: BLUE CROSS/BLUE SHIELD

## 2018-01-03 DIAGNOSIS — E7800 Pure hypercholesterolemia, unspecified: Secondary | ICD-10-CM

## 2018-01-03 LAB — LIPID PANEL
CHOL/HDL RATIO: 3
Cholesterol: 167 mg/dL (ref 0–200)
HDL: 57.4 mg/dL (ref >39.00)
LDL Cholesterol: 79 mg/dL (ref 0–99)
NONHDL: 109.81
Triglycerides: 153 mg/dL — ABNORMAL HIGH (ref 0.0–149.0)
VLDL: 30.6 mg/dL (ref 0.0–40.0)

## 2018-01-03 LAB — COMPREHENSIVE METABOLIC PANEL
ALK PHOS: 93 U/L (ref 39–117)
ALT: 25 U/L (ref 0–35)
AST: 17 U/L (ref 0–37)
Albumin: 4.5 g/dL (ref 3.5–5.2)
BILIRUBIN TOTAL: 0.6 mg/dL (ref 0.2–1.2)
BUN: 12 mg/dL (ref 6–23)
CO2: 29 meq/L (ref 19–32)
Calcium: 9.6 mg/dL (ref 8.4–10.5)
Chloride: 102 mEq/L (ref 96–112)
Creatinine, Ser: 0.71 mg/dL (ref 0.40–1.20)
GFR: 90.16 mL/min (ref >60.00)
GLUCOSE: 120 mg/dL — AB (ref 70–99)
Potassium: 4.3 mEq/L (ref 3.5–5.1)
SODIUM: 138 meq/L (ref 135–145)
TOTAL PROTEIN: 7.5 g/dL (ref 6.0–8.3)

## 2018-01-10 ENCOUNTER — Other Ambulatory Visit: Payer: Self-pay

## 2018-01-10 ENCOUNTER — Encounter: Payer: Self-pay | Admitting: Family Medicine

## 2018-01-10 ENCOUNTER — Ambulatory Visit (INDEPENDENT_AMBULATORY_CARE_PROVIDER_SITE_OTHER): Payer: BLUE CROSS/BLUE SHIELD | Admitting: Family Medicine

## 2018-01-10 VITALS — BP 110/62 | HR 92 | Temp 98.7°F | Ht 63.0 in | Wt 185.5 lb

## 2018-01-10 DIAGNOSIS — I10 Essential (primary) hypertension: Secondary | ICD-10-CM

## 2018-01-10 DIAGNOSIS — Z0000 Encounter for general adult medical examination without abnormal findings: Secondary | ICD-10-CM | POA: Diagnosis not present

## 2018-01-10 DIAGNOSIS — R7303 Prediabetes: Secondary | ICD-10-CM | POA: Diagnosis not present

## 2018-01-10 DIAGNOSIS — E7800 Pure hypercholesterolemia, unspecified: Secondary | ICD-10-CM

## 2018-01-10 DIAGNOSIS — Z23 Encounter for immunization: Secondary | ICD-10-CM | POA: Diagnosis not present

## 2018-01-10 NOTE — Patient Instructions (Signed)
Work on low carbohydrate.. Low sugar, low starch diet!

## 2018-01-10 NOTE — Progress Notes (Signed)
Subjective:    Patient ID: Kara Burke, female    DOB: Dec 18, 1961, 57 y.o.   MRN: [id number]  HPI The patient is here for annual wellness exam and preventative care.    Hypertension:    Using medication without problems or lightheadedness:  Chest pain with exertion: Edema: Short of breath: Average home BPs: Other issues:  Elevated Cholesterol: At goal goal on lipitor 40 mg daily Using medications without problems: none Muscle aches: none Diet compliance: healthy diet Exercise: walking 7 miles a day Other complaints: Body mass index is 32.86 kg/m. Wt Readings from Last 3 Encounters:  01/10/18 185 lb 8 oz (84.1 kg)  01/07/17 177 lb 8 oz (80.5 kg)  01/06/16 170 lb 8 oz (77.3 kg)    Social History /Family History/Past Medical History reviewed in detail and updated in EMR if needed. Blood pressure 110/62, pulse 92, temperature 98.7 F (37.1 C), temperature source Oral, height 5\' 3"  (1.6 m), weight 185 lb 8 oz (84.1 kg).  Review of Systems  Constitutional: Negative for fatigue and fever.  HENT: Negative for congestion.   Eyes: Negative for pain.  Respiratory: Negative for cough and shortness of breath.   Cardiovascular: Negative for chest pain, palpitations and leg swelling.  Gastrointestinal: Negative for abdominal pain.  Genitourinary: Negative for dysuria and vaginal bleeding.  Musculoskeletal: Negative for back pain.  Neurological: Negative for syncope, light-headedness and headaches.  Psychiatric/Behavioral: Negative for dysphoric mood.       Objective:   Physical Exam  Constitutional: Vital signs are normal. She appears well-developed and well-nourished. She is cooperative.  Non-toxic appearance. She does not appear ill. No distress.  HENT:  Head: Normocephalic.  Right Ear: Hearing, tympanic membrane, external ear and ear canal normal.  Left Ear: Hearing, tympanic membrane, external ear and ear canal normal.  Nose: Nose normal.  Eyes: Conjunctivae,  EOM and lids are normal. Pupils are equal, round, and reactive to light. Lids are everted and swept, no foreign bodies found.  Neck: Trachea normal and normal range of motion. Neck supple. Carotid bruit is not present. No thyroid mass and no thyromegaly present.  Cardiovascular: Normal rate, regular rhythm, S1 normal, S2 normal, normal heart sounds and intact distal pulses. Exam reveals no gallop.  No murmur heard. Pulmonary/Chest: Effort normal and breath sounds normal. No respiratory distress. She has no wheezes. She has no rhonchi. She has no rales.  Abdominal: Soft. Normal appearance and bowel sounds are normal. She exhibits no distension, no fluid wave, no abdominal bruit and no mass. There is no hepatosplenomegaly. There is no tenderness. There is no rebound, no guarding and no CVA tenderness. No hernia.  Lymphadenopathy:    She has no cervical adenopathy.    She has no axillary adenopathy.  Neurological: She is alert. She has normal strength. No cranial nerve deficit or sensory deficit.  Skin: Skin is warm, dry and intact. No rash noted.  Psychiatric: Her speech is normal and behavior is normal. Judgment normal. Her mood appears not anxious. Cognition and memory are normal. She does not exhibit a depressed mood.          Assessment & Plan:  The patient's preventative maintenance and recommended screening tests for an annual wellness exam were reviewed in full today. Brought up to date unless services declined.  Counselled on the importance of diet, exercise, and its role in overall health and mortality. The patient's FH and SH was reviewed, including their home life, tobacco status, and drug and alcohol status.  Vaccines: uptodate except due for flu.. Given today. Pap/DVE:  Last pap 2016.Marland Kitchen Plan q 5 years given neg HPV. No family history of ovarian cancer. Mammo:02/2017, repeat q 2 years Bone Density:low risk Colon:  No family history... Refused at this time. Smoking Status:  remote former, asymptomatic Hep C:  done HIV screen:  refused

## 2018-01-10 NOTE — Assessment & Plan Note (Signed)
Well controlled. Continue current medication. On statin.. At goal.

## 2018-01-10 NOTE — Assessment & Plan Note (Signed)
Well controlled. Continue current medication.  

## 2018-01-10 NOTE — Assessment & Plan Note (Signed)
Weight gain and worse control than last year... Family history of DM.  Encouraged exercise, weight loss, healthy eating habits.

## 2018-01-30 ENCOUNTER — Other Ambulatory Visit: Payer: Self-pay | Admitting: Family Medicine

## 2018-01-30 DIAGNOSIS — Z1231 Encounter for screening mammogram for malignant neoplasm of breast: Secondary | ICD-10-CM

## 2018-03-10 ENCOUNTER — Ambulatory Visit
Admission: RE | Admit: 2018-03-10 | Discharge: 2018-03-10 | Disposition: A | Discharge status: Home / Self Care | Payer: BLUE CROSS/BLUE SHIELD | Source: Ambulatory Visit | Attending: Family Medicine | Admitting: Family Medicine

## 2018-03-10 DIAGNOSIS — Z1231 Encounter for screening mammogram for malignant neoplasm of breast: Secondary | ICD-10-CM | POA: Insufficient documentation

## 2018-04-29 ENCOUNTER — Other Ambulatory Visit: Payer: Self-pay | Admitting: Family Medicine

## 2018-10-19 ENCOUNTER — Ambulatory Visit (INDEPENDENT_AMBULATORY_CARE_PROVIDER_SITE_OTHER): Payer: BLUE CROSS/BLUE SHIELD

## 2018-10-19 DIAGNOSIS — Z23 Encounter for immunization: Secondary | ICD-10-CM | POA: Diagnosis not present

## 2019-01-10 ENCOUNTER — Telehealth: Payer: Self-pay | Admitting: Family Medicine

## 2019-01-10 DIAGNOSIS — I10 Essential (primary) hypertension: Secondary | ICD-10-CM

## 2019-01-10 DIAGNOSIS — R7303 Prediabetes: Secondary | ICD-10-CM

## 2019-01-10 NOTE — Telephone Encounter (Signed)
-----   Message from Wendi Maya, RT sent at 01/01/2019 10:55 AM EST ----- Regarding: Lab orders for Thurs 01/11/19 Please enter CPE lab orders for 01/11/19. Thanks!

## 2019-01-11 ENCOUNTER — Other Ambulatory Visit (INDEPENDENT_AMBULATORY_CARE_PROVIDER_SITE_OTHER): Payer: BLUE CROSS/BLUE SHIELD

## 2019-01-11 DIAGNOSIS — R7303 Prediabetes: Secondary | ICD-10-CM

## 2019-01-11 DIAGNOSIS — I10 Essential (primary) hypertension: Secondary | ICD-10-CM

## 2019-01-11 LAB — COMPREHENSIVE METABOLIC PANEL
ALBUMIN: 4.5 g/dL (ref 3.5–5.2)
ALK PHOS: 99 U/L (ref 39–117)
ALT: 26 U/L (ref 0–35)
AST: 19 U/L (ref 0–37)
BILIRUBIN TOTAL: 0.5 mg/dL (ref 0.2–1.2)
BUN: 13 mg/dL (ref 6–23)
CO2: 29 mEq/L (ref 19–32)
Calcium: 9.9 mg/dL (ref 8.4–10.5)
Chloride: 102 mEq/L (ref 96–112)
Creatinine, Ser: 0.84 mg/dL (ref 0.40–1.20)
GFR: 74 mL/min (ref >60.00)
GLUCOSE: 112 mg/dL — AB (ref 70–99)
Potassium: 4.8 mEq/L (ref 3.5–5.1)
Sodium: 139 mEq/L (ref 135–145)
Total Protein: 7.8 g/dL (ref 6.0–8.3)

## 2019-01-11 LAB — LIPID PANEL
CHOLESTEROL: 152 mg/dL (ref 0–200)
HDL: 58.9 mg/dL (ref >39.00)
LDL Cholesterol: 72 mg/dL (ref 0–99)
NONHDL: 93.23
Total CHOL/HDL Ratio: 3
Triglycerides: 104 mg/dL (ref 0.0–149.0)
VLDL: 20.8 mg/dL (ref 0.0–40.0)

## 2019-01-11 LAB — HEMOGLOBIN A1C: Hgb A1c MFr Bld: 5.5 % (ref 4.6–6.5)

## 2019-01-12 ENCOUNTER — Encounter: Payer: BLUE CROSS/BLUE SHIELD | Admitting: Family Medicine

## 2019-01-18 ENCOUNTER — Encounter: Payer: Self-pay | Admitting: Family Medicine

## 2019-01-18 ENCOUNTER — Other Ambulatory Visit: Payer: Self-pay | Admitting: Family Medicine

## 2019-01-18 ENCOUNTER — Ambulatory Visit (INDEPENDENT_AMBULATORY_CARE_PROVIDER_SITE_OTHER): Payer: BLUE CROSS/BLUE SHIELD | Admitting: Family Medicine

## 2019-01-18 VITALS — BP 110/60 | HR 79 | Temp 98.5°F | Ht 63.5 in | Wt 189.5 lb

## 2019-01-18 DIAGNOSIS — E7800 Pure hypercholesterolemia, unspecified: Secondary | ICD-10-CM | POA: Diagnosis not present

## 2019-01-18 DIAGNOSIS — R7303 Prediabetes: Secondary | ICD-10-CM

## 2019-01-18 DIAGNOSIS — Z0000 Encounter for general adult medical examination without abnormal findings: Secondary | ICD-10-CM | POA: Diagnosis not present

## 2019-01-18 DIAGNOSIS — I10 Essential (primary) hypertension: Secondary | ICD-10-CM | POA: Diagnosis not present

## 2019-01-18 NOTE — Assessment & Plan Note (Signed)
Excellent on statin. Encouraged exercise, weight loss, healthy eating habits.

## 2019-01-18 NOTE — Patient Instructions (Signed)
Keep working on healthy eating and regular exercise! 

## 2019-01-18 NOTE — Assessment & Plan Note (Signed)
Improved and A1c in nml range.

## 2019-01-18 NOTE — Assessment & Plan Note (Signed)
Good control on on medication.

## 2019-01-18 NOTE — Progress Notes (Signed)
Subjective:    Patient ID: Kara DossNeeltje Johanna Van Der Kara Burke, female    DOB: [birth year], 58 y.o.   MRN: [id number]  HPI The patient is here for annual wellness exam and preventative care.    Hypertension:   good control on no med.  BP Readings from Last 3 Encounters:  01/18/19 110/60  01/10/18 110/62  01/07/17 120/60  Using medication without problems or lightheadedness:  none Chest pain with exertion:none Edema:none Short of breath:none Average home BPs: Other issues: Wt Readings from Last 3 Encounters:  01/18/19 189 lb 8 oz (86 kg)  01/10/18 185 lb 8 oz (84.1 kg)  01/07/17 177 lb 8 oz (80.5 kg)   Body mass index is 33.04 kg/m.   Elevated Cholesterol:  LDL at goal on statin. Lab Results  Component Value Date   CHOL 152 01/11/2019   HDL 58.90 01/11/2019   LDLCALC 72 01/11/2019   LDLDIRECT 93.6 12/30/2014   TRIG 104.0 01/11/2019   CHOLHDL 3 01/11/2019  Using medications without problems: Muscle aches:  Diet compliance: healthy diet Exercise: walking daily Other complaints:   prediabetes   Improved and in nml range. Lab Results  Component Value Date   HGBA1C 5.5 01/11/2019    Social History /Family History/Past Medical History reviewed in detail and updated in EMR if needed. Blood pressure 110/60, pulse 79, temperature 98.5 F (36.9 C), temperature source Oral, height 5' 3.5" (1.613 m), weight 189 lb 8 oz (86 kg), SpO2 96 %.  Review of Systems  Constitutional: Negative for fatigue and fever.  HENT: Negative for congestion.   Eyes: Negative for pain.  Respiratory: Negative for cough and shortness of breath.   Cardiovascular: Negative for chest pain, palpitations and leg swelling.  Gastrointestinal: Negative for abdominal pain.  Genitourinary: Negative for dysuria and vaginal bleeding.  Musculoskeletal: Negative for back pain.  Neurological: Negative for syncope, light-headedness and headaches.  Psychiatric/Behavioral: Negative for dysphoric  mood.       Objective:   Physical Exam Constitutional:      General: She is not in acute distress.    Appearance: Normal appearance. She is well-developed. She is not ill-appearing or toxic-appearing.  HENT:     Head: Normocephalic.     Right Ear: Hearing, tympanic membrane, ear canal and external ear normal.     Left Ear: Hearing, tympanic membrane, ear canal and external ear normal.     Nose: Nose normal.  Eyes:     General: Lids are normal. Lids are everted, no foreign bodies appreciated.     Conjunctiva/sclera: Conjunctivae normal.     Pupils: Pupils are equal, round, and reactive to light.  Neck:     Musculoskeletal: Normal range of motion and neck supple.     Thyroid: No thyroid mass or thyromegaly.     Vascular: No carotid bruit.     Trachea: Trachea normal.  Cardiovascular:     Rate and Rhythm: Normal rate and regular rhythm.     Heart sounds: Normal heart sounds, S1 normal and S2 normal. No murmur. No gallop.   Pulmonary:     Effort: Pulmonary effort is normal. No respiratory distress.     Breath sounds: Normal breath sounds. No wheezing, rhonchi or rales.  Abdominal:     General: Bowel sounds are normal. There is no distension or abdominal bruit.     Palpations: Abdomen is soft. There is no fluid wave or mass.     Tenderness: There is no abdominal tenderness. There is  no guarding or rebound.     Hernia: No hernia is present.  Lymphadenopathy:     Cervical: No cervical adenopathy.  Skin:    General: Skin is warm and dry.     Findings: No rash.  Neurological:     Mental Status: She is alert.     Cranial Nerves: No cranial nerve deficit.     Sensory: No sensory deficit.  Psychiatric:        Mood and Affect: Mood is not anxious or depressed.        Speech: Speech normal.        Behavior: Behavior normal. Behavior is cooperative.        Judgment: Judgment normal.           Assessment & Plan:  The patient's preventative maintenance and recommended screening  tests for an annual wellness exam were reviewed in full today. Brought up to date unless services declined.  Counselled on the importance of diet, exercise, and its role in overall health and mortality. The patient's FH and SH was reviewed, including their home life, tobacco status, and drug and alcohol status.   Vaccines:uptodate  Pap/DVE:Last pap 2016.Marland Kitchen Plan q 5 years given neg HPV. No family history of ovarian cancer. Asymptomatic. Mammo:02/2018, repeat q 1-2 years Bone Density:low risk.. start age 63 Colon:No family history... Refused at this time.. refuses  stool cards and colonoscopy. Smoking Status:Remote former, asymptomatic Hep C:done HIV screen:Refused

## 2019-02-02 ENCOUNTER — Other Ambulatory Visit: Payer: Self-pay | Admitting: Family Medicine

## 2019-02-02 DIAGNOSIS — Z1231 Encounter for screening mammogram for malignant neoplasm of breast: Secondary | ICD-10-CM

## 2019-03-12 ENCOUNTER — Ambulatory Visit
Admission: RE | Admit: 2019-03-12 | Discharge: 2019-03-12 | Disposition: A | Discharge status: Home / Self Care | Payer: BLUE CROSS/BLUE SHIELD | Source: Ambulatory Visit | Attending: Family Medicine | Admitting: Family Medicine

## 2019-03-12 ENCOUNTER — Other Ambulatory Visit: Payer: Self-pay

## 2019-03-12 DIAGNOSIS — Z1231 Encounter for screening mammogram for malignant neoplasm of breast: Secondary | ICD-10-CM | POA: Insufficient documentation

## 2019-04-22 ENCOUNTER — Other Ambulatory Visit: Payer: Self-pay | Admitting: Family Medicine

## 2019-09-07 ENCOUNTER — Ambulatory Visit (INDEPENDENT_AMBULATORY_CARE_PROVIDER_SITE_OTHER): Payer: BC Managed Care – PPO

## 2019-09-07 DIAGNOSIS — Z23 Encounter for immunization: Secondary | ICD-10-CM

## 2020-01-09 ENCOUNTER — Other Ambulatory Visit: Payer: Self-pay | Admitting: Family Medicine

## 2020-01-11 ENCOUNTER — Telehealth: Payer: Self-pay

## 2020-01-11 NOTE — Telephone Encounter (Signed)
LVM to call clinic, pt needs COVID screen, front door and back lab info 1.15.2021 TLJ

## 2020-01-14 ENCOUNTER — Telehealth: Payer: Self-pay | Admitting: Family Medicine

## 2020-01-14 DIAGNOSIS — R7303 Prediabetes: Secondary | ICD-10-CM

## 2020-01-14 DIAGNOSIS — E7800 Pure hypercholesterolemia, unspecified: Secondary | ICD-10-CM

## 2020-01-14 NOTE — Telephone Encounter (Signed)
-----   Message from Alvina Chou sent at 01/08/2020  2:55 PM EST ----- Regarding: Lab orders for Tuesday, 1.19.21 Patient is scheduled for CPX labs, please order future labs, Thanks , Camelia Eng

## 2020-01-15 ENCOUNTER — Other Ambulatory Visit: Payer: Self-pay

## 2020-01-15 ENCOUNTER — Other Ambulatory Visit (INDEPENDENT_AMBULATORY_CARE_PROVIDER_SITE_OTHER): Payer: BC Managed Care – PPO

## 2020-01-15 DIAGNOSIS — R7303 Prediabetes: Secondary | ICD-10-CM | POA: Diagnosis not present

## 2020-01-15 DIAGNOSIS — E7800 Pure hypercholesterolemia, unspecified: Secondary | ICD-10-CM

## 2020-01-15 LAB — COMPREHENSIVE METABOLIC PANEL
ALT: 21 U/L (ref 0–35)
AST: 16 U/L (ref 0–37)
Albumin: 4.5 g/dL (ref 3.5–5.2)
Alkaline Phosphatase: 107 U/L (ref 39–117)
BUN: 21 mg/dL (ref 6–23)
CO2: 29 mEq/L (ref 19–32)
Calcium: 9.5 mg/dL (ref 8.4–10.5)
Chloride: 100 mEq/L (ref 96–112)
Creatinine, Ser: 0.76 mg/dL (ref 0.40–1.20)
GFR: 77.87 mL/min (ref >60.00)
Glucose, Bld: 121 mg/dL — ABNORMAL HIGH (ref 70–99)
Potassium: 4.5 mEq/L (ref 3.5–5.1)
Sodium: 137 mEq/L (ref 135–145)
Total Bilirubin: 0.6 mg/dL (ref 0.2–1.2)
Total Protein: 7.7 g/dL (ref 6.0–8.3)

## 2020-01-15 LAB — LIPID PANEL
Cholesterol: 173 mg/dL (ref 0–200)
HDL: 56.5 mg/dL (ref >39.00)
LDL Cholesterol: 84 mg/dL (ref 0–99)
NonHDL: 116.1
Total CHOL/HDL Ratio: 3
Triglycerides: 159 mg/dL — ABNORMAL HIGH (ref 0.0–149.0)
VLDL: 31.8 mg/dL (ref 0.0–40.0)

## 2020-01-15 LAB — HEMOGLOBIN A1C: Hgb A1c MFr Bld: 5.6 % (ref 4.6–6.5)

## 2020-01-15 NOTE — Progress Notes (Signed)
No critical labs need to be addressed urgently. We will discuss labs in detail at upcoming office visit.   

## 2020-01-22 ENCOUNTER — Encounter: Payer: BLUE CROSS/BLUE SHIELD | Admitting: Family Medicine

## 2020-01-22 ENCOUNTER — Ambulatory Visit (INDEPENDENT_AMBULATORY_CARE_PROVIDER_SITE_OTHER): Payer: BC Managed Care – PPO | Admitting: Family Medicine

## 2020-01-22 ENCOUNTER — Other Ambulatory Visit: Payer: Self-pay

## 2020-01-22 ENCOUNTER — Encounter: Payer: Self-pay | Admitting: Family Medicine

## 2020-01-22 VITALS — BP 143/75 | HR 93 | Temp 98.5°F | Ht 63.0 in | Wt 187.5 lb

## 2020-01-22 DIAGNOSIS — Z0000 Encounter for general adult medical examination without abnormal findings: Secondary | ICD-10-CM | POA: Diagnosis not present

## 2020-01-22 DIAGNOSIS — F419 Anxiety disorder, unspecified: Secondary | ICD-10-CM

## 2020-01-22 DIAGNOSIS — F329 Major depressive disorder, single episode, unspecified: Secondary | ICD-10-CM | POA: Diagnosis not present

## 2020-01-22 DIAGNOSIS — G8929 Other chronic pain: Secondary | ICD-10-CM

## 2020-01-22 DIAGNOSIS — F32A Depression, unspecified: Secondary | ICD-10-CM

## 2020-01-22 DIAGNOSIS — R0789 Other chest pain: Secondary | ICD-10-CM | POA: Diagnosis not present

## 2020-01-22 DIAGNOSIS — E7800 Pure hypercholesterolemia, unspecified: Secondary | ICD-10-CM | POA: Diagnosis not present

## 2020-01-22 DIAGNOSIS — F5104 Psychophysiologic insomnia: Secondary | ICD-10-CM | POA: Diagnosis not present

## 2020-01-22 DIAGNOSIS — M25512 Pain in left shoulder: Secondary | ICD-10-CM

## 2020-01-22 DIAGNOSIS — I10 Essential (primary) hypertension: Secondary | ICD-10-CM

## 2020-01-22 DIAGNOSIS — K219 Gastro-esophageal reflux disease without esophagitis: Secondary | ICD-10-CM

## 2020-01-22 DIAGNOSIS — R7303 Prediabetes: Secondary | ICD-10-CM

## 2020-01-22 NOTE — Assessment & Plan Note (Signed)
Possible SE to statin. Pt will try trial off.  Also consistent with mild Rotator cuff tendonitis.Marland Kitchen given home PT to start.

## 2020-01-22 NOTE — Assessment & Plan Note (Signed)
Rare occurrence.. may be cause of chest pain.. can use pepcid AC prn.

## 2020-01-22 NOTE — Assessment & Plan Note (Signed)
Poor control in office.. unclear if due to anxiety, pt is slightly tachycardiac as well. May also be white caot HTN.  She will follow at home.. may need to restart BP medication.  Eval TSH.  EKG unremarkable otherwise.

## 2020-01-22 NOTE — Assessment & Plan Note (Signed)
Reviewed healthy sleep hygeine.

## 2020-01-22 NOTE — Patient Instructions (Addendum)
Stop atorvastatin  1-2 weeks to see if side effect are improved. If they are try to start back at a lower dose.. 1/2 tab daily, or 3 days aweek. Follow BP at home.Marland Kitchen goal BP < 140/90.  For heart burn use Pepcid AC.  Please stop at the lab to have labs drawn.

## 2020-01-22 NOTE — Progress Notes (Signed)
Chief Complaint  Patient presents with  . Annual Exam    History of Present Illness: HPI  The patient is here for annual wellness exam and preventative care.     She has been having left arm pain in the last year.   She is having trouble sleeping at night, not able to stay asleep. She is under a lot of stress, she feels depressed. She feels that her mood is worse in the last several year. Somewhat worse since pandemic.  She is more tearful.  She tried herbal med for anxiety and stress.. did not help.  She does not want to take a medication to treat this.  PHQ 9:11, GAD7 11  She is not interested in counselor   Hypertension:    Previously well controlled on no med BP Readings from Last 3 Encounters:  01/22/20 (!) 150/86  01/18/19 110/60  01/10/18 110/62  Using medication without problems or lightheadedness: none Chest pain with exertion:none, occ pain in chest in middle of night , not often ( occurring 1-2 times ever 3-4 months) Edema:none Short of breath:none Average home BPs: Other issues: on recheck BP lower BP Readings from Last 3 Encounters:  01/22/20 (!) 143/75  01/18/19 110/60  01/10/18 110/62     Prediabetes stable A1C 5.6  Elevated Cholesterol:  LDL at goal on  Atorvastatin. Lab Results  Component Value Date   CHOL 173 01/15/2020   HDL 56.50 01/15/2020   LDLCALC 84 01/15/2020   LDLDIRECT 93.6 12/30/2014   TRIG 159.0 (H) 01/15/2020   CHOLHDL 3 01/15/2020  Using medications without problems: Muscle aches:  Diet compliance: poor diet Exercise: 3 miles daily Other complaints:  Wt Readings from Last 3 Encounters:  01/22/20 187 lb 8 oz (85 kg)  01/18/19 189 lb 8 oz (86 kg)  01/10/18 185 lb 8 oz (84.1 kg)     This visit occurred during the SARS-CoV-2 public health emergency.  Safety protocols were in place, including screening questions prior to the visit, additional usage of staff PPE, and extensive cleaning of exam room while observing appropriate  contact time as indicated for disinfecting solutions.   COVID 19 screen:  No recent travel or known exposure to COVID19 The patient denies respiratory symptoms of COVID 19 at this time. The importance of social distancing was discussed today.     Review of Systems  Constitutional: Negative for fever.  HENT: Negative for congestion.   Eyes: Negative for pain.  Respiratory: Negative for cough, shortness of breath and wheezing.   Gastrointestinal: Negative for abdominal pain, constipation, diarrhea, nausea and vomiting.  Genitourinary: Negative for dysuria.  Musculoskeletal: Joint pain: pr pr.  Skin: Negative for rash.      Past Medical History:  Diagnosis Date  . Eczema   . Herpes zoster without mention of complication   . 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 Pruritus ani   . Pure hypercholesterolemia   . Routine general medical examination at a health care facility   . Screening for lipoid disorders     reports that she has quit smoking. She has never used smokeless tobacco. She reports current alcohol use. She reports that she does not use drugs.   Current Outpatient Medications:  .  ACETAMINOPHEN PO, Take by mouth., Disp: , Rfl:  .  atorvastatin (LIPITOR) 40 MG tablet, TAKE 1 TABLET BY MOUTH EVERY DAY, Disp: 90 tablet, Rfl: 0 .  FIBER SELECT GUMMIES PO, Take 2 each by mouth daily., Disp: , Rfl:  .  Multiple Vitamins-Minerals (HAIR/SKIN/NAILS PO), Take 2 each by mouth daily. , Disp: , Rfl:  .  Multiple Vitamins-Minerals (WOMENS MULTIVITAMIN PO), Take 1 tablet by mouth daily., Disp: , Rfl:  .  omeprazole (PRILOSEC OTC) 20 MG tablet, Take 20 mg by mouth daily as needed., Disp: , Rfl:    Observations/Objective: Blood pressure (!) 150/86, pulse 93, temperature 98.5 F (36.9 C), temperature source Temporal, height 5\' 3"  (1.6 m), weight 187 lb 8 oz (85 kg), SpO2 98 %.  Physical Exam Constitutional:      General: She is not in acute  distress.    Appearance: Normal appearance. She is well-developed. She is not ill-appearing or toxic-appearing.  HENT:     Head: Normocephalic.     Right Ear: Hearing, tympanic membrane, ear canal and external ear normal.     Left Ear: Hearing, tympanic membrane, ear canal and external ear normal.     Nose: Nose normal.  Eyes:     General: Lids are normal. Lids are everted, no foreign bodies appreciated.     Conjunctiva/sclera: Conjunctivae normal.     Pupils: Pupils are equal, round, and reactive to light.  Neck:     Thyroid: No thyroid mass or thyromegaly.     Vascular: No carotid bruit.     Trachea: Trachea normal.  Cardiovascular:     Rate and Rhythm: Normal rate and regular rhythm.     Heart sounds: Normal heart sounds, S1 normal and S2 normal. No murmur. No gallop.   Pulmonary:     Effort: Pulmonary effort is normal. No respiratory distress.     Breath sounds: Normal breath sounds. No wheezing, rhonchi or rales.  Abdominal:     General: Bowel sounds are normal. There is no distension or abdominal bruit.     Palpations: Abdomen is soft. There is no fluid wave or mass.     Tenderness: There is no abdominal tenderness. There is no guarding or rebound.     Hernia: No hernia is present.  Musculoskeletal:     Cervical back: Normal range of motion and neck supple.  Lymphadenopathy:     Cervical: No cervical adenopathy.  Skin:    General: Skin is warm and dry.     Findings: No rash.  Neurological:     Mental Status: She is alert.     Cranial Nerves: No cranial nerve deficit.     Sensory: No sensory deficit.  Psychiatric:        Mood and Affect: Mood is not anxious or depressed.        Speech: Speech normal.        Behavior: Behavior normal. Behavior is cooperative.        Judgment: Judgment normal.      Assessment and Plan The patient's preventative maintenance and recommended screening tests for an annual wellness exam were reviewed in full today. Brought up to date  unless services declined.  Counselled on the importance of diet, exercise, and its role in overall health and mortality. The patient's FH and SH was reviewed, including their home life, tobacco status, and drug and alcohol status.   Vaccines:uptodate  Pap/DVE:Last pap 2016.2017 Plan q 5 years given neg HPV. No family history of ovarian cancer. Asymptomatic. PAP DUE TODAY 12/2019 BUT PT REFUSES Mammo:02/2019, repeat q 1-2 years Bone Density:low risk.. start age 67 Colon:No family history... Refused again 12/2019.. refuses  stool cards and colonoscopy. Understands risk Smoking Status:Remote former, asymptomatic Hep C:done HIV screen:Refused   Chronic left  shoulder pain  Possible SE to statin. Pt will try trial off.  Also consistent with mild Rotator cuff tendonitis.Marland Kitchen given home PT to start.   ESSENTIAL HYPERTENSION, BENIGN Poor control in office.. unclear if due to anxiety, pt is slightly tachycardiac as well. May also be white caot HTN.  She will follow at home.. may need to restart BP medication.  Eval TSH.  EKG unremarkable otherwise.  HYPERCHOLESTEROLEMIA Well controlled but will try a trial of statin as possible SE.  GERD (gastroesophageal reflux disease) Rare occurrence.. may be cause of chest pain.. can use pepcid AC prn.  Chest pain, atypical EKG unremarkable.. most consistent with GI source.  No exertional component.  Anxiety and depression Poor control, moderate but pt refuses medication or counseling.  If not improving she will re-consider.   Chronic insomnia Reviewed healthy sleep hygeine.     Kerby Nora, MD

## 2020-01-22 NOTE — Assessment & Plan Note (Signed)
EKG unremarkable.. most consistent with GI source.  No exertional component.

## 2020-01-22 NOTE — Assessment & Plan Note (Signed)
Well controlled but will try a trial of statin as possible SE.

## 2020-01-22 NOTE — Assessment & Plan Note (Signed)
Poor control, moderate but pt refuses medication or counseling.  If not improving she will re-consider.

## 2020-01-23 ENCOUNTER — Other Ambulatory Visit (INDEPENDENT_AMBULATORY_CARE_PROVIDER_SITE_OTHER): Payer: BC Managed Care – PPO

## 2020-01-23 DIAGNOSIS — R7989 Other specified abnormal findings of blood chemistry: Secondary | ICD-10-CM | POA: Diagnosis not present

## 2020-01-23 LAB — T3, FREE: T3, Free: 3.7 pg/mL (ref 2.3–4.2)

## 2020-01-23 LAB — TSH: TSH: 4.73 u[IU]/mL — ABNORMAL HIGH (ref 0.35–4.50)

## 2020-01-23 LAB — T4, FREE: Free T4: 0.74 ng/dL (ref 0.60–1.60)

## 2020-01-28 MED ORDER — LEVOTHYROXINE SODIUM 25 MCG PO TABS: 25.0000 ug | ORAL_TABLET | Freq: Every day | ORAL | 11 refills | Status: DC

## 2020-02-21 ENCOUNTER — Telehealth: Payer: Self-pay | Admitting: Family Medicine

## 2020-02-21 NOTE — Telephone Encounter (Signed)
Pt calling back with more information about invoices.  He stated this has been going on for 4 years. He stated the name is put in incorrect and he tells person checking in and they say it has been changed  He would like a back.

## 2020-02-22 NOTE — Telephone Encounter (Signed)
Best number [phone] Spouse wanted a call back from [facility]

## 2020-02-22 NOTE — Telephone Encounter (Signed)
Spoke with patient's husband yesterday and today regarding his concerns.    Patient's husband complaining that he is tired of having to call every year for the past 4 years and have his wifes insurance fixed in our system. Doesn't understand why we can't get this fixed.   In review of what has been going on, I notice that patients name on her insurance card has changed every year for the past 3 years which has resulted in our inability to file the claim to match the insurance card name.  Every year he calls to correct, which we do.  I informed patients husband last night that the information has been verified and corrected on our end and that he should call billing department and request a re-file due the correction being made according to what I have been instructed.   Patient's husband calls back today and says he spoke with [employer] and they said it would need to come from [place]. He is very angry and upset that he is getting the run around.  I apologized for any misinformation and spoke with Gypsy Decant, my acting director, to see how we can best help this patient resolve this problem.   Lea was able to review and ensure that insurance was correct and verified for the dates of service: 1/19, 1/26 and 1/27 and ready for refile.  With all information correct, I have sent an email to charge correction to discuss re-filing the claim and to better understand [employer]'s email response to patient with being unable to help him with his request.   I will follow up with patient's husband on Monday after I have spoken to charge correction and obtained further information.

## 2020-02-25 NOTE — Telephone Encounter (Signed)
Today I followed up with our claims department Kara Burke and patient accounting, Kara Burke regarding all concerns around spouse's experience trying to resolve the billing issues for his wife.  I concluded the following:  1.  I did confirm that we have updated patient insurance and verification on our end as well as made formal request to re-submit existing denied claims.  I informed Mr. Kara Burke that as long as his wife's name (spelling) did not change again on her insurance card, we should not have this problem on our end moving forward.   2.  Patient accounting, Kara Burke, made aware of patient's spouse concerns with how he has been treated while trying to navigate the bill and re-file claim request with their department ([employer] questions/concerns hotline).  [name] reviewed the conversations and says that the email communication that spouse initiated were professional and appropriate but she will have someone reach out to him and discuss his concerns.  In addition, she stated that she could see where they reprocessed the claims today 02/25/20 per the patients original request.  Patient states that he was told, very rudely, on Friday that they would not do this. I believe that by initiating this on our end through charge correction, that it helped expedite this processing for him.   Patient spouse, Kara Burke, made aware of all communication above and that someone would be reaching out to him from accounting to address his concerns.  In closure, all outstanding bills should be reprocessed with correct information and situation should be resolved at this time.  However, if there are any concerns moving forward, Mr. Kara Burke is aware that he can call me for further support.   Patient spouse thanks me for help in this matter.

## 2020-03-06 ENCOUNTER — Ambulatory Visit: Payer: BC Managed Care – PPO | Attending: Internal Medicine

## 2020-03-06 DIAGNOSIS — Z23 Encounter for immunization: Secondary | ICD-10-CM

## 2020-03-06 NOTE — Progress Notes (Signed)
   Covid-19 Vaccination Clinic  Name:  Kara Burke    MRN: [id number] DOB: October 08, 1961  03/06/2020  Ms. Kara Burke was observed post Covid-19 immunization for 15 minutes without incident. She was provided with Vaccine Information Sheet and instruction to access the V-Safe system.   Ms. Kara Burke was instructed to call 911 with any severe reactions post vaccine: Marland Kitchen Difficulty breathing  . Swelling of face and throat  . A fast heartbeat  . A bad rash all over body  . Dizziness and weakness   Immunizations Administered    Name Date Dose VIS Date Route   Pfizer COVID-19 Vaccine 03/06/2020  9:17 AM 0.3 mL 12/07/2019 Intramuscular   Manufacturer: [employer], [employer]   Lot: [id number]   NDC: [address]-0

## 2020-03-14 ENCOUNTER — Telehealth: Payer: Self-pay

## 2020-03-14 NOTE — Telephone Encounter (Signed)
Patient's husband left me a message on 3/17 to call back regarding ongoing bill dispute for his wife.   I have been out of the office and was unable to return his call yesterday.   I did leave him a voice mail message this am returning his call and will be in office all day for him to call back.

## 2020-03-14 NOTE — Telephone Encounter (Signed)
Patients husband has called back.  He explains that the bill has been reprocessed from his wife's January office visit and the majority has been covered.  He is okay with covering the remaining bit but did call [facility] or email rather to ask for further clarification on what the remainder charges are for.    He, again, received a vague poor email response and has never once spoken to someone from their department as I was told that he would after his initial complaint a few weeks ago.  When I escalated the patient concern weeks ago, I was told by Limmie Patricia that someone from her department would be reaching out to speak with him.  Patient says this never occurred and he is not happy about that and feels that this service is very poor.    He thanks me for my support and I thanked him for the feedback and informed him that I will continue to escalate his concerns up the chain of command.

## 2020-04-01 ENCOUNTER — Other Ambulatory Visit: Payer: Self-pay | Admitting: Family Medicine

## 2020-04-02 ENCOUNTER — Ambulatory Visit: Payer: BC Managed Care – PPO | Attending: Internal Medicine

## 2020-04-02 DIAGNOSIS — Z23 Encounter for immunization: Secondary | ICD-10-CM

## 2020-04-02 NOTE — Progress Notes (Signed)
   Covid-19 Vaccination Clinic  Name:  Ketra Duchesne Der Edyth Gunnels Der Candie Echevaria    MRN: [id number] DOB: 1961/04/16  04/02/2020  Ms. Zenaida Niece Der Edyth Gunnels Der Candie Echevaria was observed post Covid-19 immunization for 15 minutes without incident. She was provided with Vaccine Information Sheet and instruction to access the V-Safe system.   Ms. Zenaida Niece Der Edyth Gunnels Der Candie Echevaria was instructed to call 911 with any severe reactions post vaccine: Marland Kitchen Difficulty breathing  . Swelling of face and throat  . A fast heartbeat  . A bad rash all over body  . Dizziness and weakness   Immunizations Administered    Name Date Dose VIS Date Route   Pfizer COVID-19 Vaccine 04/02/2020  8:38 AM 0.3 mL 12/07/2019 Intramuscular   Manufacturer: [employer], [employer]   Lot: [id number]   NDC: [address]-0

## 2020-09-17 ENCOUNTER — Other Ambulatory Visit: Payer: Self-pay

## 2020-09-17 ENCOUNTER — Ambulatory Visit (INDEPENDENT_AMBULATORY_CARE_PROVIDER_SITE_OTHER): Payer: BC Managed Care – PPO

## 2020-09-17 DIAGNOSIS — Z23 Encounter for immunization: Secondary | ICD-10-CM

## 2020-12-09 ENCOUNTER — Other Ambulatory Visit: Payer: Self-pay | Admitting: Internal Medicine

## 2020-12-09 ENCOUNTER — Ambulatory Visit: Payer: BC Managed Care – PPO | Attending: Internal Medicine

## 2020-12-09 DIAGNOSIS — Z23 Encounter for immunization: Secondary | ICD-10-CM

## 2020-12-09 NOTE — Progress Notes (Signed)
   Covid-19 Vaccination Clinic  Name:  Kara Burke    MRN: [id number] DOB: 1961/09/07  12/09/2020  Ms. Kara Burke was observed post Covid-19 immunization for 15 minutes without incident. She was provided with Vaccine Information Sheet and instruction to access the V-Safe system.   Ms. Kara Burke was instructed to call 911 with any severe reactions post vaccine: Marland Kitchen Difficulty breathing  . Swelling of face and throat  . A fast heartbeat  . A bad rash all over body  . Dizziness and weakness   Immunizations Administered    Name Date Dose VIS Date Route   Pfizer COVID-19 Vaccine 12/09/2020  9:47 AM 0.3 mL 10/15/2020 Intramuscular   Manufacturer: [employer], [employer]   Lot: [id number]   NDC: [address]-1

## 2021-01-05 ENCOUNTER — Telehealth: Payer: Self-pay | Admitting: Family Medicine

## 2021-01-05 DIAGNOSIS — E7800 Pure hypercholesterolemia, unspecified: Secondary | ICD-10-CM

## 2021-01-05 DIAGNOSIS — R7303 Prediabetes: Secondary | ICD-10-CM

## 2021-01-05 NOTE — Telephone Encounter (Signed)
-----   Message from Aquilla Solian, RT sent at 01/05/2021  3:18 PM EST ----- Regarding: Lab Orders for Wednesday 1.26.2022 Please place lab orders for Wednesday 1.26.2022, office visit for physical on Tuesday 2.1.2022 Thank you, Jones Bales RT(R)

## 2021-01-21 ENCOUNTER — Other Ambulatory Visit (INDEPENDENT_AMBULATORY_CARE_PROVIDER_SITE_OTHER): Payer: BC Managed Care – PPO

## 2021-01-21 ENCOUNTER — Other Ambulatory Visit: Payer: Self-pay

## 2021-01-21 DIAGNOSIS — R7303 Prediabetes: Secondary | ICD-10-CM | POA: Diagnosis not present

## 2021-01-21 DIAGNOSIS — E7800 Pure hypercholesterolemia, unspecified: Secondary | ICD-10-CM | POA: Diagnosis not present

## 2021-01-21 LAB — COMPREHENSIVE METABOLIC PANEL
ALT: 30 U/L (ref 0–35)
AST: 18 U/L (ref 0–37)
Albumin: 4.3 g/dL (ref 3.5–5.2)
Alkaline Phosphatase: 98 U/L (ref 39–117)
BUN: 16 mg/dL (ref 6–23)
CO2: 27 mEq/L (ref 19–32)
Calcium: 9.7 mg/dL (ref 8.4–10.5)
Chloride: 102 mEq/L (ref 96–112)
Creatinine, Ser: 0.76 mg/dL (ref 0.40–1.20)
GFR: 85.36 mL/min (ref >60.00)
Glucose, Bld: 122 mg/dL — ABNORMAL HIGH (ref 70–99)
Potassium: 3.8 mEq/L (ref 3.5–5.1)
Sodium: 138 mEq/L (ref 135–145)
Total Bilirubin: 0.5 mg/dL (ref 0.2–1.2)
Total Protein: 7.5 g/dL (ref 6.0–8.3)

## 2021-01-21 LAB — LIPID PANEL
Cholesterol: 231 mg/dL — ABNORMAL HIGH (ref 0–200)
HDL: 46.9 mg/dL (ref >39.00)
LDL Cholesterol: 145 mg/dL — ABNORMAL HIGH (ref 0–99)
NonHDL: 184.16
Total CHOL/HDL Ratio: 5
Triglycerides: 194 mg/dL — ABNORMAL HIGH (ref 0.0–149.0)
VLDL: 38.8 mg/dL (ref 0.0–40.0)

## 2021-01-21 LAB — HEMOGLOBIN A1C: Hgb A1c MFr Bld: 5.8 % (ref 4.6–6.5)

## 2021-01-22 NOTE — Progress Notes (Signed)
No critical labs need to be addressed urgently. We will discuss labs in detail at upcoming office visit.   

## 2021-01-27 ENCOUNTER — Other Ambulatory Visit: Payer: Self-pay

## 2021-01-27 ENCOUNTER — Other Ambulatory Visit (HOSPITAL_COMMUNITY)
Admission: RE | Admit: 2021-01-27 | Discharge: 2021-01-27 | Disposition: A | Discharge status: Home / Self Care | Payer: BC Managed Care – PPO | Source: Ambulatory Visit | Attending: Family Medicine | Admitting: Family Medicine

## 2021-01-27 ENCOUNTER — Ambulatory Visit (INDEPENDENT_AMBULATORY_CARE_PROVIDER_SITE_OTHER): Payer: BC Managed Care – PPO | Admitting: Family Medicine

## 2021-01-27 ENCOUNTER — Encounter: Payer: Self-pay | Admitting: Family Medicine

## 2021-01-27 VITALS — BP 162/84 | HR 116 | Temp 98.2°F | Ht 63.0 in | Wt 195.8 lb

## 2021-01-27 DIAGNOSIS — I10 Essential (primary) hypertension: Secondary | ICD-10-CM

## 2021-01-27 DIAGNOSIS — R7303 Prediabetes: Secondary | ICD-10-CM

## 2021-01-27 DIAGNOSIS — Z23 Encounter for immunization: Secondary | ICD-10-CM

## 2021-01-27 DIAGNOSIS — Z1211 Encounter for screening for malignant neoplasm of colon: Secondary | ICD-10-CM

## 2021-01-27 DIAGNOSIS — Z124 Encounter for screening for malignant neoplasm of cervix: Secondary | ICD-10-CM | POA: Diagnosis not present

## 2021-01-27 DIAGNOSIS — E7800 Pure hypercholesterolemia, unspecified: Secondary | ICD-10-CM

## 2021-01-27 DIAGNOSIS — Z789 Other specified health status: Secondary | ICD-10-CM

## 2021-01-27 DIAGNOSIS — E038 Other specified hypothyroidism: Secondary | ICD-10-CM

## 2021-01-27 DIAGNOSIS — Z0000 Encounter for general adult medical examination without abnormal findings: Secondary | ICD-10-CM

## 2021-01-27 MED ORDER — EZETIMIBE 10 MG PO TABS: 10.0000 mg | ORAL_TABLET | Freq: Every day | ORAL | 11 refills | Status: DC

## 2021-01-27 NOTE — Progress Notes (Signed)
Patient ID: [name], female    DOB: [birth year], 60 y.o.   MRN: [id number]  This visit was conducted in person.  BP (!) 162/84   Pulse (!) 116   Temp 98.2 F (36.8 C) (Temporal)   Ht 5\' 3"  (1.6 m)   Wt 195 lb 12 oz (88.8 kg)   SpO2 97%   BMI 34.68 kg/m    CC:  Chief Complaint  Patient presents with  . Annual Exam    Subjective:   HPI: Kara Der Farrel Gobble Der Edyth Burke is a 60 y.o. female presenting on 01/27/2021 for Annual Exam  Hypertension:  BP above goal in office today . On no medication. Has white coat hypertension. Using medication without problems or lightheadedness: none Chest pain with exertion:none Edema:none Short of breath: none Average home BPs: At home BP 109-121/58-77 Other issues:  Elevated Cholesterol:  tolerable control. She had resolution of arm pain on  atorvastatin 40 mg daily... off now for  year Lab Results  Component Value Date   CHOL 231 (H) 01/21/2021   HDL 46.90 01/21/2021   LDLCALC 145 (H) 01/21/2021   LDLDIRECT 93.6 12/30/2014   TRIG 194.0 (H) 01/21/2021   CHOLHDL 5 01/21/2021  Using medications without problems: Muscle aches:  Diet compliance: avoiding animal fats Exercise: walking daily Other complaints: The 10-year ASCVD risk score 01/23/2021 DC Denman George., et al., 2013) is: 6.4%   Values used to calculate the score:     Age: 77 years     Sex: Female     Is Non-Hispanic African American: No     Diabetic: No     Tobacco smoker: No     Systolic Blood Pressure: 162 mmHg     Is BP treated: No     HDL Cholesterol: 46.9 mg/dL     Total Cholesterol: 231 mg/dL   Prediabetes  Lab Results  Component Value Date   HGBA1C 5.8 01/21/2021    Hypothyroid  Free t3 and free t4 at goal. She never took to thyroid medications. Lab Results  Component Value Date   TSH 4.73 (H) 01/22/2020         Relevant past medical, surgical, family and social history reviewed and updated as indicated. Interim  medical history since our last visit reviewed. Allergies and medications reviewed and updated. Outpatient Medications Prior to Visit  Medication Sig Dispense Refill  . ACETAMINOPHEN PO Take by mouth.    . Multiple Vitamins-Minerals (WOMENS MULTIVITAMIN PO) Take 1 tablet by mouth daily.    01/24/2020 omeprazole (PRILOSEC OTC) 20 MG tablet Take 20 mg by mouth daily as needed.    Marland Kitchen atorvastatin (LIPITOR) 40 MG tablet TAKE 1 TABLET BY MOUTH EVERY DAY (Patient not taking: Reported on 01/27/2021) 90 tablet 3  . levothyroxine (SYNTHROID) 25 MCG tablet Take 1 tablet (25 mcg total) by mouth daily before breakfast. (Patient not taking: Reported on 01/27/2021) 30 tablet 11  . FIBER SELECT GUMMIES PO Take 2 each by mouth daily.    . Multiple Vitamins-Minerals (HAIR/SKIN/NAILS PO) Take 2 each by mouth daily.      No facility-administered medications prior to visit.     Per HPI unless specifically indicated in ROS section below Review of Systems  Constitutional: Negative for fatigue and fever.  HENT: Negative for congestion and ear pain.   Eyes: Negative for pain.  Respiratory: Negative for cough, chest tightness and shortness of breath.   Cardiovascular: Negative for chest pain,  palpitations and leg swelling.  Gastrointestinal: Negative for abdominal pain.  Genitourinary: Negative for dysuria and vaginal bleeding.  Musculoskeletal: Negative for back pain.  Neurological: Negative for syncope, light-headedness and headaches.  Psychiatric/Behavioral: Negative for dysphoric mood.   Objective:  BP (!) 162/84   Pulse (!) 116   Temp 98.2 F (36.8 C) (Temporal)   Ht 5\' 3"  (1.6 m)   Wt 195 lb 12 oz (88.8 kg)   SpO2 97%   BMI 34.68 kg/m   Wt Readings from Last 3 Encounters:  01/27/21 195 lb 12 oz (88.8 kg)  01/22/20 187 lb 8 oz (85 kg)  01/18/19 189 lb 8 oz (86 kg)      Physical Exam Constitutional:      General: She is not in acute distress.Vital signs are normal.     Appearance: Normal appearance. She  is well-developed and well-nourished. She is not ill-appearing or toxic-appearing.  HENT:     Head: Normocephalic.     Right Ear: Hearing, tympanic membrane, ear canal and external ear normal.     Left Ear: Hearing, tympanic membrane, ear canal and external ear normal.     Nose: Nose normal.  Eyes:     General: Lids are normal. Lids are everted, no foreign bodies appreciated.     Extraocular Movements: EOM normal.     Conjunctiva/sclera: Conjunctivae normal.     Pupils: Pupils are equal, round, and reactive to light.  Neck:     Thyroid: No thyroid mass or thyromegaly.     Vascular: No carotid bruit.     Trachea: Trachea normal.  Cardiovascular:     Rate and Rhythm: Normal rate and regular rhythm.     Pulses: Intact distal pulses.     Heart sounds: Normal heart sounds, S1 normal and S2 normal. No murmur heard. No gallop.   Pulmonary:     Effort: Pulmonary effort is normal. No respiratory distress.     Breath sounds: Normal breath sounds. No wheezing, rhonchi or rales.  Chest:  Breasts: No discharge from either breast. No tenderness and bleeding.    Abdominal:     General: Bowel sounds are normal. There is no distension or abdominal bruit.     Palpations: Abdomen is soft. There is no fluid wave, hepatosplenomegaly or mass.     Tenderness: There is no abdominal tenderness. There is no CVA tenderness, guarding or rebound.     Hernia: No hernia is present.  Genitourinary:    Exam position: Supine.     Labia:        Right: No rash, tenderness or lesion.        Left: No rash, tenderness or lesion.      Vagina: Normal.     Cervix: No cervical motion tenderness, discharge or friability.     Uterus: Normal. Not enlarged and not tender.      Adnexa:        Right: No mass, tenderness or fullness.         Left: No mass, tenderness or fullness.    Musculoskeletal:     Cervical back: Normal range of motion and neck supple.  Lymphadenopathy:     Cervical: No cervical adenopathy.      Upper Body:  No axillary adenopathy present. Skin:    General: Skin is warm, dry and intact.     Findings: No rash.  Neurological:     Mental Status: She is alert.     Cranial Nerves: No cranial nerve deficit.  Sensory: No sensory deficit.     Deep Tendon Reflexes: Strength normal.  Psychiatric:        Mood and Affect: Mood is not anxious or depressed.        Speech: Speech normal.        Behavior: Behavior normal. Behavior is cooperative.        Cognition and Memory: Cognition and memory normal.        Judgment: Judgment normal.       Results for orders placed or performed in visit on 01/21/21  Comprehensive metabolic panel  Result Value Ref Range   Sodium 138 135 - 145 mEq/L   Potassium 3.8 3.5 - 5.1 mEq/L   Chloride 102 96 - 112 mEq/L   CO2 27 19 - 32 mEq/L   Glucose, Bld 122 (H) 70 - 99 mg/dL   BUN 16 6 - 23 mg/dL   Creatinine, Ser 7.74 0.40 - 1.20 mg/dL   Total Bilirubin 0.5 0.2 - 1.2 mg/dL   Alkaline Phosphatase 98 39 - 117 U/L   AST 18 0 - 37 U/L   ALT 30 0 - 35 U/L   Total Protein 7.5 6.0 - 8.3 g/dL   Albumin 4.3 3.5 - 5.2 g/dL   GFR 14.23 >95.32 mL/min   Calcium 9.7 8.4 - 10.5 mg/dL  Lipid panel  Result Value Ref Range   Cholesterol 231 (H) 0 - 200 mg/dL   Triglycerides 023.3 (H) 0.0 - 149.0 mg/dL   HDL 43.56 >86.16 mg/dL   VLDL 83.7 0.0 - 29.0 mg/dL   LDL Cholesterol 211 (H) 0 - 99 mg/dL   Total CHOL/HDL Ratio 5    NonHDL 184.16   Hemoglobin A1c  Result Value Ref Range   Hgb A1c MFr Bld 5.8 4.6 - 6.5 %    This visit occurred during the SARS-CoV-2 public health emergency.  Safety protocols were in place, including screening questions prior to the visit, additional usage of staff PPE, and extensive cleaning of exam room while observing appropriate contact time as indicated for disinfecting solutions.   COVID 19 screen:  No recent travel or known exposure to COVID19 The patient denies respiratory symptoms of COVID 19 at this time. The importance of  social distancing was discussed today.   Assessment and Plan   The patient's preventative maintenance and recommended screening tests for an annual wellness exam were reviewed in full today. Brought up to date unless services declined.  Counselled on the importance of diet, exercise, and its role in overall health and mortality. The patient's FH and SH was reviewed, including their home life, tobacco status, and drug and alcohol status.   Vaccines:uptodate .Marland Kitchen RX for shingrix given Pap/DVE:Last pap 2016.Marland Kitchen Plan q 5 years given neg HPV. No family history of ovarian cancer.Asymptomatic. PAP DUE TODAY  Mammo:02/2019, repeat q1-2 years Bone Density:low risk.. start age 16-65 Colon:No family history... Will do yearly stool cards. Smoking Status:Remote former, asymptomatic Hep C:done HIV screen:Refused.  Problem List Items Addressed This Visit    ESSENTIAL HYPERTENSION, BENIGN    Stable, chronic.   On no medication.         HYPERCHOLESTEROLEMIA    Tolerable control.. statin intolerant. Atorvastatin cause arm pain. Encouraged exercise, weight loss, healthy eating habits.       Relevant Orders   Lipid panel   Comprehensive metabolic panel   Prediabetes    Encouraged exercise, weight loss, healthy eating habits.       Statin intolerance    Cause  arm pain, unable to tolerate.       Other Visit Diagnoses    Routine general medical examination at a health care facility    -  Primary   Subclinical hypothyroidism       Relevant Orders   TSH   T4, free   T3, free   Colon cancer screening       Relevant Orders   Fecal occult blood, imunochemical (Completed)   Cervical cancer screening       Relevant Orders   Cytology - PAP([facility]) (Completed)   Need for shingles vaccine       Relevant Orders   Varicella-zoster vaccine IM (Shingrix) (Completed)      Kerby Nora, MD

## 2021-01-27 NOTE — Patient Instructions (Addendum)
Make nurse visit appt for second shingrix vaccine in 2-6 months.  Return for recheck cholesterol in 3 months fasting.  Start vit D at least 400 IU daily.  Return stool cards please.   Please call the location of your choice from the menu below to schedule your Mammogram and/or Bone Density appointment.       [place]  1. [facility] at [facility]   Phone:  [phone]   [address]                                                                            [place], [place] [address]                                            Services: 3D Mammogram and Bone Density  2. [facility] at [facility])  Phone:  [phone]   [address]                        [place], [place] [address]                                              Services:  3D Mammogram and Bone Density

## 2021-01-28 ENCOUNTER — Other Ambulatory Visit: Payer: Self-pay | Admitting: Family Medicine

## 2021-01-28 DIAGNOSIS — Z1231 Encounter for screening mammogram for malignant neoplasm of breast: Secondary | ICD-10-CM

## 2021-01-29 ENCOUNTER — Other Ambulatory Visit (INDEPENDENT_AMBULATORY_CARE_PROVIDER_SITE_OTHER): Payer: BC Managed Care – PPO

## 2021-01-29 DIAGNOSIS — Z1211 Encounter for screening for malignant neoplasm of colon: Secondary | ICD-10-CM | POA: Diagnosis not present

## 2021-01-29 LAB — FECAL OCCULT BLOOD, IMMUNOCHEMICAL: Fecal Occult Bld: NEGATIVE

## 2021-02-02 LAB — CYTOLOGY - PAP
Comment: NEGATIVE
Diagnosis: NEGATIVE
High risk HPV: NEGATIVE

## 2021-02-23 MED ORDER — EZETIMIBE 10 MG PO TABS: 10.0000 mg | ORAL_TABLET | Freq: Every day | ORAL | 3 refills | Status: DC

## 2021-03-11 ENCOUNTER — Other Ambulatory Visit: Payer: Self-pay

## 2021-03-11 ENCOUNTER — Ambulatory Visit
Admission: RE | Admit: 2021-03-11 | Discharge: 2021-03-11 | Disposition: A | Discharge status: Home / Self Care | Payer: BC Managed Care – PPO | Source: Ambulatory Visit | Attending: Family Medicine | Admitting: Family Medicine

## 2021-03-11 DIAGNOSIS — Z1231 Encounter for screening mammogram for malignant neoplasm of breast: Secondary | ICD-10-CM | POA: Diagnosis not present

## 2021-03-13 ENCOUNTER — Other Ambulatory Visit: Payer: Self-pay | Admitting: Family Medicine

## 2021-03-13 DIAGNOSIS — R928 Other abnormal and inconclusive findings on diagnostic imaging of breast: Secondary | ICD-10-CM

## 2021-03-13 DIAGNOSIS — R2232 Localized swelling, mass and lump, left upper limb: Secondary | ICD-10-CM

## 2021-03-18 NOTE — Telephone Encounter (Signed)
I called [facility] and they said that they are waiting for you to sign the pended Ultrasound order that was put in by [place] at [place]. Once that is done they will call patient. Thank you

## 2021-03-18 NOTE — Telephone Encounter (Signed)
It was placed as an orders only encounter.  I have forward it back to you.  You just need to sign pending orders at the bottom.

## 2021-03-22 DIAGNOSIS — Z789 Other specified health status: Secondary | ICD-10-CM | POA: Insufficient documentation

## 2021-03-22 NOTE — Assessment & Plan Note (Signed)
Stable ,chronic  On no medication. 

## 2021-03-22 NOTE — Assessment & Plan Note (Signed)
Tolerable control.. statin intolerant. Atorvastatin cause arm pain. Encouraged exercise, weight loss, healthy eating habits.

## 2021-03-22 NOTE — Assessment & Plan Note (Signed)
Encouraged exercise, weight loss, healthy eating habits. ? ?

## 2021-03-22 NOTE — Assessment & Plan Note (Signed)
Cause arm pain, unable to tolerate.

## 2021-03-25 ENCOUNTER — Ambulatory Visit
Admission: RE | Admit: 2021-03-25 | Discharge: 2021-03-25 | Disposition: A | Discharge status: Home / Self Care | Payer: BC Managed Care – PPO | Source: Ambulatory Visit | Attending: Family Medicine | Admitting: Family Medicine

## 2021-03-25 ENCOUNTER — Other Ambulatory Visit: Payer: Self-pay

## 2021-03-25 DIAGNOSIS — R2232 Localized swelling, mass and lump, left upper limb: Secondary | ICD-10-CM | POA: Insufficient documentation

## 2021-03-25 DIAGNOSIS — R928 Other abnormal and inconclusive findings on diagnostic imaging of breast: Secondary | ICD-10-CM | POA: Diagnosis not present

## 2021-03-25 DIAGNOSIS — N6489 Other specified disorders of breast: Secondary | ICD-10-CM | POA: Diagnosis not present

## 2021-04-07 ENCOUNTER — Ambulatory Visit (INDEPENDENT_AMBULATORY_CARE_PROVIDER_SITE_OTHER): Payer: BC Managed Care – PPO

## 2021-04-07 ENCOUNTER — Other Ambulatory Visit: Payer: Self-pay

## 2021-04-07 DIAGNOSIS — Z23 Encounter for immunization: Secondary | ICD-10-CM | POA: Diagnosis not present

## 2021-04-07 NOTE — Progress Notes (Signed)
Per orders of Dr. Para March, in Dr. Daphine Deutscher absence, 2nd injection of shingrix given by Erby Pian. Patient tolerated injection well.

## 2021-05-01 ENCOUNTER — Other Ambulatory Visit (INDEPENDENT_AMBULATORY_CARE_PROVIDER_SITE_OTHER): Payer: BC Managed Care – PPO

## 2021-05-01 ENCOUNTER — Other Ambulatory Visit: Payer: Self-pay

## 2021-05-01 DIAGNOSIS — E7800 Pure hypercholesterolemia, unspecified: Secondary | ICD-10-CM

## 2021-05-01 LAB — COMPREHENSIVE METABOLIC PANEL
ALT: 51 U/L — ABNORMAL HIGH (ref 0–35)
AST: 33 U/L (ref 0–37)
Albumin: 4.4 g/dL (ref 3.5–5.2)
Alkaline Phosphatase: 107 U/L (ref 39–117)
BUN: 9 mg/dL (ref 6–23)
CO2: 28 mEq/L (ref 19–32)
Calcium: 9.6 mg/dL (ref 8.4–10.5)
Chloride: 100 mEq/L (ref 96–112)
Creatinine, Ser: 0.77 mg/dL (ref 0.40–1.20)
GFR: 83.87 mL/min (ref >60.00)
Glucose, Bld: 140 mg/dL — ABNORMAL HIGH (ref 70–99)
Potassium: 4 mEq/L (ref 3.5–5.1)
Sodium: 137 mEq/L (ref 135–145)
Total Bilirubin: 0.9 mg/dL (ref 0.2–1.2)
Total Protein: 7.8 g/dL (ref 6.0–8.3)

## 2021-05-01 LAB — LIPID PANEL
Cholesterol: 174 mg/dL (ref 0–200)
HDL: 58.7 mg/dL (ref >39.00)
LDL Cholesterol: 93 mg/dL (ref 0–99)
NonHDL: 115.18
Total CHOL/HDL Ratio: 3
Triglycerides: 112 mg/dL (ref 0.0–149.0)
VLDL: 22.4 mg/dL (ref 0.0–40.0)

## 2021-05-19 ENCOUNTER — Other Ambulatory Visit: Payer: Self-pay

## 2021-05-19 ENCOUNTER — Ambulatory Visit: Payer: BC Managed Care – PPO | Attending: Internal Medicine

## 2021-05-19 DIAGNOSIS — Z23 Encounter for immunization: Secondary | ICD-10-CM

## 2021-05-19 MED ORDER — PFIZER-BIONT COVID-19 VAC-TRIS 30 MCG/0.3ML IM SUSP
INTRAMUSCULAR | 0 refills | Status: DC
  Filled 2021-05-19: qty 0.3, 1d supply, fill #0

## 2021-05-19 NOTE — Progress Notes (Signed)
   Covid-19 Vaccination Clinic  Name:  Kara Burke    MRN: [id number] DOB: [birth year]  05/19/2021  Ms. Kara Burke was observed post Covid-19 immunization for 15 minutes without incident. She was provided with Vaccine Information Sheet and instruction to access the V-Safe system.   Ms. Kara Burke was instructed to call 911 with any severe reactions post vaccine: Marland Kitchen Difficulty breathing  . Swelling of face and throat  . A fast heartbeat  . A bad rash all over body  . Dizziness and weakness   Immunizations Administered    Name Date Dose VIS Date Route   PFIZER Comrnaty(Gray TOP) Covid-19 Vaccine 05/19/2021  9:28 AM 0.3 mL 12/04/2020 Intramuscular   Manufacturer: [employer], [employer]   Lot: [id number]   NDC: [address]-1     Drusilla Kanner, PharmD, MBA Clinical Acute Care Pharmacist

## 2021-09-03 ENCOUNTER — Ambulatory Visit (INDEPENDENT_AMBULATORY_CARE_PROVIDER_SITE_OTHER): Payer: BC Managed Care – PPO | Admitting: *Deleted

## 2021-09-03 ENCOUNTER — Other Ambulatory Visit: Payer: Self-pay

## 2021-09-03 DIAGNOSIS — Z23 Encounter for immunization: Secondary | ICD-10-CM | POA: Diagnosis not present

## 2021-09-03 NOTE — Progress Notes (Signed)
Per orders of Dr. Ermalene Searing, injection of Influenza vaccine given in left deltoid by Ileana Ladd. Patient tolerated injection well.

## 2021-09-08 ENCOUNTER — Ambulatory Visit: Payer: BC Managed Care – PPO

## 2021-09-18 ENCOUNTER — Other Ambulatory Visit: Payer: Self-pay

## 2021-09-18 ENCOUNTER — Ambulatory Visit: Payer: BC Managed Care – PPO | Attending: Internal Medicine

## 2021-09-18 DIAGNOSIS — Z23 Encounter for immunization: Secondary | ICD-10-CM

## 2021-09-18 MED ORDER — PFIZER COVID-19 VAC BIVALENT 30 MCG/0.3ML IM SUSP
INTRAMUSCULAR | 0 refills | Status: DC
  Filled 2021-09-18: qty 0.3, 1d supply, fill #0

## 2021-09-18 NOTE — Progress Notes (Signed)
   Covid-19 Vaccination Clinic  Name:  Kara Burke    MRN: [id number] DOB: 12/06/1961  09/18/2021  Ms. Kara Burke was observed post Covid-19 immunization for 15 minutes without incident. She was provided with Vaccine Information Sheet and instruction to access the V-Safe system.   Ms. Kara Burke was instructed to call 911 with any severe reactions post vaccine: Difficulty breathing  Swelling of face and throat  A fast heartbeat  A bad rash all over body  Dizziness and weakness   Drusilla Kanner, PharmD, MBA Clinical Acute Care Pharmacist

## 2022-01-15 ENCOUNTER — Telehealth: Payer: Self-pay | Admitting: Family Medicine

## 2022-01-15 MED ORDER — EZETIMIBE 10 MG PO TABS: 10.0000 mg | ORAL_TABLET | Freq: Every day | ORAL | 0 refills | Status: DC

## 2022-01-15 NOTE — Telephone Encounter (Signed)
Refill sent as requested. 

## 2022-01-15 NOTE — Telephone Encounter (Signed)
°  Encourage patient to contact the pharmacy for refills or they can request refills through [facility]  LAST APPOINTMENT DATE:  Please schedule appointment if longer than 1 year  NEXT APPOINTMENT DATE:  MEDICATION:ezetimibe (ZETIA) 10 MG tablet   Is the patient out of medication?   PHARMACY:[id number] [place], [place] - [address]   Let patient know to contact pharmacy at the end of the day to make sure medication is ready.  Please notify patient to allow 48-72 hours to process  CLINICAL FILLS OUT ALL BELOW:   LAST REFILL:  QTY:  REFILL DATE:    OTHER COMMENTS:    Okay for refill?  Please advise

## 2022-01-18 ENCOUNTER — Telehealth: Payer: Self-pay | Admitting: Family Medicine

## 2022-01-18 DIAGNOSIS — R7303 Prediabetes: Secondary | ICD-10-CM

## 2022-01-18 DIAGNOSIS — E7800 Pure hypercholesterolemia, unspecified: Secondary | ICD-10-CM

## 2022-01-18 DIAGNOSIS — E038 Other specified hypothyroidism: Secondary | ICD-10-CM

## 2022-01-18 NOTE — Telephone Encounter (Signed)
-----   Message from Alvina Chou sent at 01/04/2022  8:22 AM EST ----- Regarding: Lab orders for Thursday, 1.26.23 Patient is scheduled for CPX labs, please order future labs, Thanks , Camelia Eng

## 2022-01-21 ENCOUNTER — Other Ambulatory Visit: Payer: BC Managed Care – PPO

## 2022-01-28 ENCOUNTER — Encounter: Payer: BC Managed Care – PPO | Admitting: Family Medicine

## 2022-01-29 ENCOUNTER — Other Ambulatory Visit: Payer: Self-pay | Admitting: Family Medicine

## 2022-02-09 ENCOUNTER — Other Ambulatory Visit: Payer: Self-pay | Admitting: Family Medicine

## 2022-02-09 DIAGNOSIS — Z1231 Encounter for screening mammogram for malignant neoplasm of breast: Secondary | ICD-10-CM

## 2022-02-19 ENCOUNTER — Other Ambulatory Visit: Payer: BC Managed Care – PPO

## 2022-02-26 ENCOUNTER — Encounter: Payer: BC Managed Care – PPO | Admitting: Family Medicine

## 2022-04-06 ENCOUNTER — Ambulatory Visit
Admission: RE | Admit: 2022-04-06 | Discharge: 2022-04-06 | Disposition: A | Discharge status: Home / Self Care | Payer: BC Managed Care – PPO | Source: Ambulatory Visit | Attending: Family Medicine | Admitting: Family Medicine

## 2022-04-06 DIAGNOSIS — Z1231 Encounter for screening mammogram for malignant neoplasm of breast: Secondary | ICD-10-CM | POA: Diagnosis not present

## 2022-04-08 ENCOUNTER — Other Ambulatory Visit (INDEPENDENT_AMBULATORY_CARE_PROVIDER_SITE_OTHER): Payer: BC Managed Care – PPO

## 2022-04-08 DIAGNOSIS — R7303 Prediabetes: Secondary | ICD-10-CM | POA: Diagnosis not present

## 2022-04-08 DIAGNOSIS — E038 Other specified hypothyroidism: Secondary | ICD-10-CM

## 2022-04-08 DIAGNOSIS — E7800 Pure hypercholesterolemia, unspecified: Secondary | ICD-10-CM | POA: Diagnosis not present

## 2022-04-08 LAB — COMPREHENSIVE METABOLIC PANEL
ALT: 21 U/L (ref 0–35)
AST: 15 U/L (ref 0–37)
Albumin: 4.5 g/dL (ref 3.5–5.2)
Alkaline Phosphatase: 88 U/L (ref 39–117)
BUN: 16 mg/dL (ref 6–23)
CO2: 30 mEq/L (ref 19–32)
Calcium: 10.1 mg/dL (ref 8.4–10.5)
Chloride: 99 mEq/L (ref 96–112)
Creatinine, Ser: 0.81 mg/dL (ref 0.40–1.20)
GFR: 78.41 mL/min (ref >60.00)
Glucose, Bld: 121 mg/dL — ABNORMAL HIGH (ref 70–99)
Potassium: 4.7 mEq/L (ref 3.5–5.1)
Sodium: 137 mEq/L (ref 135–145)
Total Bilirubin: 0.5 mg/dL (ref 0.2–1.2)
Total Protein: 7.5 g/dL (ref 6.0–8.3)

## 2022-04-08 LAB — T4, FREE: Free T4: 0.8 ng/dL (ref 0.60–1.60)

## 2022-04-08 LAB — T3, FREE: T3, Free: 3.3 pg/mL (ref 2.3–4.2)

## 2022-04-08 LAB — LIPID PANEL
Cholesterol: 209 mg/dL — ABNORMAL HIGH (ref 0–200)
HDL: 51.7 mg/dL (ref >39.00)
NonHDL: 157.72
Total CHOL/HDL Ratio: 4
Triglycerides: 236 mg/dL — ABNORMAL HIGH (ref 0.0–149.0)
VLDL: 47.2 mg/dL — ABNORMAL HIGH (ref 0.0–40.0)

## 2022-04-08 LAB — TSH: TSH: 6.72 u[IU]/mL — ABNORMAL HIGH (ref 0.35–5.50)

## 2022-04-08 LAB — HEMOGLOBIN A1C: Hgb A1c MFr Bld: 6 % (ref 4.6–6.5)

## 2022-04-08 LAB — LDL CHOLESTEROL, DIRECT: Direct LDL: 139 mg/dL

## 2022-04-08 NOTE — Progress Notes (Signed)
No critical labs need to be addressed urgently. We will discuss labs in detail at upcoming office visit.   

## 2022-04-20 ENCOUNTER — Ambulatory Visit (INDEPENDENT_AMBULATORY_CARE_PROVIDER_SITE_OTHER): Payer: BC Managed Care – PPO | Admitting: Family Medicine

## 2022-04-20 ENCOUNTER — Encounter: Payer: Self-pay | Admitting: Family Medicine

## 2022-04-20 VITALS — BP 138/78 | HR 88 | Temp 98.7°F | Resp 16 | Ht 63.0 in | Wt 192.6 lb

## 2022-04-20 DIAGNOSIS — Z23 Encounter for immunization: Secondary | ICD-10-CM | POA: Diagnosis not present

## 2022-04-20 DIAGNOSIS — E038 Other specified hypothyroidism: Secondary | ICD-10-CM

## 2022-04-20 DIAGNOSIS — Z0000 Encounter for general adult medical examination without abnormal findings: Secondary | ICD-10-CM | POA: Diagnosis not present

## 2022-04-20 DIAGNOSIS — I10 Essential (primary) hypertension: Secondary | ICD-10-CM

## 2022-04-20 DIAGNOSIS — E7800 Pure hypercholesterolemia, unspecified: Secondary | ICD-10-CM

## 2022-04-20 DIAGNOSIS — R7303 Prediabetes: Secondary | ICD-10-CM

## 2022-04-20 DIAGNOSIS — R350 Frequency of micturition: Secondary | ICD-10-CM

## 2022-04-20 DIAGNOSIS — Z1211 Encounter for screening for malignant neoplasm of colon: Secondary | ICD-10-CM

## 2022-04-20 DIAGNOSIS — M25511 Pain in right shoulder: Secondary | ICD-10-CM

## 2022-04-20 DIAGNOSIS — M5416 Radiculopathy, lumbar region: Secondary | ICD-10-CM

## 2022-04-20 DIAGNOSIS — Z789 Other specified health status: Secondary | ICD-10-CM

## 2022-04-20 LAB — POCT URINALYSIS DIP (CLINITEK)
Bilirubin, UA: NEGATIVE
Blood, UA: NEGATIVE
Glucose, UA: NEGATIVE mg/dL
Ketones, POC UA: NEGATIVE mg/dL
Leukocytes, UA: NEGATIVE
Nitrite, UA: NEGATIVE
POC PROTEIN,UA: NEGATIVE
Spec Grav, UA: 1.025 (ref 1.010–1.025)
Urobilinogen, UA: 0.2 E.U./dL
pH, UA: 5.5 (ref 5.0–8.0)

## 2022-04-20 MED ORDER — PRAVASTATIN SODIUM 10 MG PO TABS: ORAL_TABLET | ORAL | 3 refills | Status: DC

## 2022-04-20 NOTE — Progress Notes (Signed)
? ? Patient ID: Kara Burke, female    DOB: [birth year], 61 y.o.   MRN: [id number] ? ?This visit was conducted in person. ? ?BP 138/78   Pulse 88   Temp 98.7 ?F (37.1 ?C)   Resp 16   Ht 5\' 3"  (1.6 m)   Wt 192 lb 9 oz (87.3 kg)   SpO2 97%   BMI 34.11 kg/m?   ? ?CC:  ?Chief Complaint  ?Patient presents with  ? Annual Exam  ? ? ?Subjective:  ? ?HPI: ?Illana Der Farrel Burke is a 61 y.o. female presenting on 04/20/2022 for Annual Exam ? ? ?Hypertension:    Well controlled on no medicaiton.  ?BP Readings from Last 3 Encounters:  ?04/20/22 138/78  ?01/27/21 (!) 162/84  ?01/22/20 (!) 143/75  ? ?Using medication without problems or lightheadedness:  none ?Chest pain with exertion: none ?Edema:none ?Short of breath: none ?Average home BPs:110-123/555-67 HR 60-75 ?Other issues: ? ?Elevated Cholesterol: Moderate control on zetia. Intolerant of statins ( cause arm pain) Can ?Lab Results  ?Component Value Date  ? CHOL 209 (H) 04/08/2022  ? HDL 51.70 04/08/2022  ? LDLCALC 93 05/01/2021  ? LDLDIRECT 139.0 04/08/2022  ? TRIG 236.0 (H) 04/08/2022  ? CHOLHDL 4 04/08/2022  ?Using medications without problems: ?Muscle aches:  ?Diet compliance: heart healthy diet ?Exercise: walking 20 min a day. ?Other complaints: ? ?  Prediabetes  ?Lab Results  ?Component Value Date  ? HGBA1C 6.0 04/08/2022  ? ?Hypothyroid, subclinical  ?Lab Results  ?Component Value Date  ? TSH 6.72 (H) 04/08/2022  ? ?Free t3 and free t4 nml. ? ?Relevant past medical, surgical, family and social history reviewed and updated as indicated. Interim medical history since our last visit reviewed. ?Allergies and medications reviewed and updated. ?Outpatient Medications Prior to Visit  ?Medication Sig Dispense Refill  ? ACETAMINOPHEN PO Take by mouth.    ? Cholecalciferol (PA VITAMIN D-3 GUMMY PO) Take by mouth.    ? COVID-19 mRNA bivalent vaccine, Pfizer, (PFIZER COVID-19 VAC BIVALENT) injection Inject into the muscle. 0.3 mL 0  ? COVID-19  mRNA Vac-TriS, Pfizer, (PFIZER-BIONT COVID-19 VAC-TRIS) SUSP injection Inject into the muscle. 0.3 mL 0  ? [employer] Fiber Gummies 2 g CHEW Chew by mouth.    ? ezetimibe (ZETIA) 10 MG tablet Take 1 tablet (10 mg total) by mouth daily. 90 tablet 0  ? Multiple Vitamins-Minerals (WOMENS MULTIVITAMIN PO) Take 1 tablet by mouth daily.    ? omeprazole (PRILOSEC OTC) 20 MG tablet Take 20 mg by mouth daily as needed.    ? ?No facility-administered medications prior to visit.  ?  ? ?Per HPI unless specifically indicated in ROS section below ?Review of Systems  ?Musculoskeletal:  Positive for arthralgias and back pain.  ?     Low back pain radiating into left upper thigh ?No numbness or weakness ? ?Right shoulder pain with external and internal rotation  ?Objective:  ?BP 138/78   Pulse 88   Temp 98.7 ?F (37.1 ?C)   Resp 16   Ht 5\' 3"  (1.6 m)   Wt 192 lb 9 oz (87.3 kg)   SpO2 97%   BMI 34.11 kg/m?   ?Wt Readings from Last 3 Encounters:  ?04/20/22 192 lb 9 oz (87.3 kg)  ?01/27/21 195 lb 12 oz (88.8 kg)  ?01/22/20 187 lb 8 oz (85 kg)  ?  ?  ?Physical Exam ?Vitals and nursing note reviewed.  ?Constitutional:   ?   General:  She is not in acute distress. ?   Appearance: Normal appearance. She is well-developed. She is not ill-appearing or toxic-appearing.  ?HENT:  ?   Head: Normocephalic.  ?   Right Ear: Hearing, tympanic membrane, ear canal and external ear normal. Tympanic membrane is not erythematous, retracted or bulging.  ?   Left Ear: Hearing, tympanic membrane, ear canal and external ear normal. Tympanic membrane is not erythematous, retracted or bulging.  ?   Nose: Nose normal. No mucosal edema or rhinorrhea.  ?   Right Sinus: No maxillary sinus tenderness or frontal sinus tenderness.  ?   Left Sinus: No maxillary sinus tenderness or frontal sinus tenderness.  ?   Mouth/Throat:  ?   Pharynx: Uvula midline.  ?Eyes:  ?   General: Lids are normal. Lids are everted, no foreign bodies appreciated.  ?   Conjunctiva/sclera:  Conjunctivae normal.  ?   Pupils: Pupils are equal, round, and reactive to light.  ?Neck:  ?   Thyroid: No thyroid mass or thyromegaly.  ?   Vascular: No carotid bruit.  ?   Trachea: Trachea normal.  ?Cardiovascular:  ?   Rate and Rhythm: Normal rate and regular rhythm.  ?   Pulses: Normal pulses.  ?   Heart sounds: Normal heart sounds, S1 normal and S2 normal. No murmur heard. ?  No friction rub. No gallop.  ?Pulmonary:  ?   Effort: Pulmonary effort is normal. No tachypnea or respiratory distress.  ?   Breath sounds: Normal breath sounds. No decreased breath sounds, wheezing, rhonchi or rales.  ?Abdominal:  ?   General: Bowel sounds are normal. There is no distension or abdominal bruit.  ?   Palpations: Abdomen is soft. There is no fluid wave or mass.  ?   Tenderness: There is no abdominal tenderness. There is no guarding or rebound.  ?   Hernia: No hernia is present.  ?Musculoskeletal:  ?   Cervical back: Normal range of motion and neck supple.  ?Lymphadenopathy:  ?   Cervical: No cervical adenopathy.  ?Skin: ?   General: Skin is warm and dry.  ?   Findings: No rash.  ?Neurological:  ?   Mental Status: She is alert.  ?   Cranial Nerves: No cranial nerve deficit.  ?   Sensory: No sensory deficit.  ?Psychiatric:     ?   Mood and Affect: Mood is not anxious or depressed.     ?   Speech: Speech normal.     ?   Behavior: Behavior normal. Behavior is cooperative.     ?   Thought Content: Thought content normal.     ?   Judgment: Judgment normal.  ? ?   ?Results for orders placed or performed in visit on 04/08/22  ?Lipid panel  ?Result Value Ref Range  ? Cholesterol 209 (H) 0 - 200 mg/dL  ? Triglycerides 236.0 (H) 0.0 - 149.0 mg/dL  ? HDL 51.70 >39.00 mg/dL  ? VLDL 47.2 (H) 0.0 - 40.0 mg/dL  ? Total CHOL/HDL Ratio 4   ? NonHDL 157.72   ?T3, free  ?Result Value Ref Range  ? T3, Free 3.3 2.3 - 4.2 pg/mL  ?T4, free  ?Result Value Ref Range  ? Free T4 0.80 0.60 - 1.60 ng/dL  ?TSH  ?Result Value Ref Range  ? TSH 6.72 (H) 0.35  - 5.50 uIU/mL  ?Comprehensive metabolic panel  ?Result Value Ref Range  ? Sodium 137 135 - 145 mEq/L  ?  Potassium 4.7 3.5 - 5.1 mEq/L  ? Chloride 99 96 - 112 mEq/L  ? CO2 30 19 - 32 mEq/L  ? Glucose, Bld 121 (H) 70 - 99 mg/dL  ? BUN 16 6 - 23 mg/dL  ? Creatinine, Ser 0.81 0.40 - 1.20 mg/dL  ? Total Bilirubin 0.5 0.2 - 1.2 mg/dL  ? Alkaline Phosphatase 88 39 - 117 U/L  ? AST 15 0 - 37 U/L  ? ALT 21 0 - 35 U/L  ? Total Protein 7.5 6.0 - 8.3 g/dL  ? Albumin 4.5 3.5 - 5.2 g/dL  ? GFR 78.41 >60.00 mL/min  ? Calcium 10.1 8.4 - 10.5 mg/dL  ?Hemoglobin A1c  ?Result Value Ref Range  ? Hgb A1c MFr Bld 6.0 4.6 - 6.5 %  ?LDL cholesterol, direct  ?Result Value Ref Range  ? Direct LDL 139.0 mg/dL  ? ? ?This visit occurred during the SARS-CoV-2 public health emergency.  Safety protocols were in place, including screening questions prior to the visit, additional usage of staff PPE, and extensive cleaning of exam room while observing appropriate contact time as indicated for disinfecting solutions.  ? ?COVID 19 screen:  No recent travel or known exposure to COVID19 ?The patient denies respiratory symptoms of COVID 19 at this time. ?The importance of social distancing was discussed today.  ? ?Assessment and Plan ?The patient's preventative maintenance and recommended screening tests for an annual wellness exam were reviewed in full today. ?Brought up to date unless services declined. ? ?Counselled on the importance of diet, exercise, and its role in overall health and mortality. ?The patient's FH and SH was reviewed, including their home life, tobacco status, and drug and alcohol status.  ? ? ? Vaccines: uptodate .Marland Kitchen RX for shingrix given, TDap given ?Pap/DVE:  Last pap 2016.Marland Kitchen Plan q 5 years given neg HPV. No family history of ovarian cancer. Asymptomatic. PAP DUE TODAY  ?Mammo:02/2019, repeat q 1-2 years ?Bone Density:low risk.. start age 1-65 ?Colon:  No family history... Will do yearly stool cards. ?Smoking Status: Remote former,  asymptomatic ? Hep C:  done ? HIV screen:   Refused. ? ?Problem List Items Addressed This Visit   ? ? Acute pain of right shoulder  ?  Acute, minimally bothersome ? ?She will continue to exercise and was given home physi

## 2022-04-20 NOTE — Patient Instructions (Addendum)
Start home PT for left shoulder pain and low back pain. ?Can try Voltaren gel as needed for pain. ? Stop the ezetimibe as not effective and costly. ? Try 3 days a week low dose 1/2 tablet of pravastatin. ? Can recheck cholesterol in 3 months. ?Stop at lab for IFOB colon cancer screening. ?

## 2022-04-21 ENCOUNTER — Other Ambulatory Visit (INDEPENDENT_AMBULATORY_CARE_PROVIDER_SITE_OTHER): Payer: BC Managed Care – PPO

## 2022-04-21 DIAGNOSIS — E038 Other specified hypothyroidism: Secondary | ICD-10-CM | POA: Insufficient documentation

## 2022-04-21 DIAGNOSIS — M25511 Pain in right shoulder: Secondary | ICD-10-CM | POA: Insufficient documentation

## 2022-04-21 DIAGNOSIS — M5416 Radiculopathy, lumbar region: Secondary | ICD-10-CM | POA: Insufficient documentation

## 2022-04-21 DIAGNOSIS — Z1211 Encounter for screening for malignant neoplasm of colon: Secondary | ICD-10-CM | POA: Diagnosis not present

## 2022-04-21 DIAGNOSIS — R350 Frequency of micturition: Secondary | ICD-10-CM | POA: Insufficient documentation

## 2022-04-21 LAB — FECAL OCCULT BLOOD, IMMUNOCHEMICAL: Fecal Occult Bld: NEGATIVE

## 2022-04-21 NOTE — Assessment & Plan Note (Signed)
Well controlled on no medication  

## 2022-04-21 NOTE — Assessment & Plan Note (Signed)
She is willing to retry a statin as long as it is low-dose. ?

## 2022-04-21 NOTE — Assessment & Plan Note (Signed)
Chronic, poor control ? ?It does not seem to be making much of a difference and is very expensive for her.  We will stop this medication.  In the past she had significant improvement with atorvastatin but it contributed to some significant muscle pain.  We will try a very low-dose of pravastatin 5 mg 3 days a week and then reassess cholesterol levels in 3 months.  She will continue to work on healthy lifestyle low-cholesterol diet and regular exercise ?

## 2022-04-21 NOTE — Assessment & Plan Note (Signed)
Encouraged exercise, weight loss, healthy eating habits. ? ?

## 2022-04-21 NOTE — Assessment & Plan Note (Signed)
Acute, minimally bothersome ? ?She will continue to exercise and was given home physical therapy stretches today.  She will make an appointment to discuss in detail if it is not improving. ?

## 2022-04-21 NOTE — Assessment & Plan Note (Signed)
Chronic, stable ? ?Remains asymptomatic, no indication for levothyroxine at this point. ?

## 2022-05-03 ENCOUNTER — Other Ambulatory Visit: Payer: Self-pay | Admitting: Family Medicine

## 2022-07-23 ENCOUNTER — Encounter: Payer: Self-pay | Admitting: Family Medicine

## 2022-07-23 ENCOUNTER — Other Ambulatory Visit (INDEPENDENT_AMBULATORY_CARE_PROVIDER_SITE_OTHER): Payer: BC Managed Care – PPO

## 2022-07-23 ENCOUNTER — Telehealth: Payer: Self-pay | Admitting: Family Medicine

## 2022-07-23 DIAGNOSIS — E7800 Pure hypercholesterolemia, unspecified: Secondary | ICD-10-CM | POA: Diagnosis not present

## 2022-07-23 LAB — COMPREHENSIVE METABOLIC PANEL
ALT: 21 U/L (ref 0–35)
AST: 16 U/L (ref 0–37)
Albumin: 4.7 g/dL (ref 3.5–5.2)
Alkaline Phosphatase: 84 U/L (ref 39–117)
BUN: 12 mg/dL (ref 6–23)
CO2: 30 mEq/L (ref 19–32)
Calcium: 9.8 mg/dL (ref 8.4–10.5)
Chloride: 101 mEq/L (ref 96–112)
Creatinine, Ser: 0.82 mg/dL (ref 0.40–1.20)
GFR: 77.1 mL/min (ref >60.00)
Glucose, Bld: 122 mg/dL — ABNORMAL HIGH (ref 70–99)
Potassium: 4.9 mEq/L (ref 3.5–5.1)
Sodium: 138 mEq/L (ref 135–145)
Total Bilirubin: 0.6 mg/dL (ref 0.2–1.2)
Total Protein: 7.7 g/dL (ref 6.0–8.3)

## 2022-07-23 LAB — LIPID PANEL
Cholesterol: 227 mg/dL — ABNORMAL HIGH (ref 0–200)
HDL: 51 mg/dL (ref >39.00)
LDL Cholesterol: 144 mg/dL — ABNORMAL HIGH (ref 0–99)
NonHDL: 176.2
Total CHOL/HDL Ratio: 4
Triglycerides: 160 mg/dL — ABNORMAL HIGH (ref 0.0–149.0)
VLDL: 32 mg/dL (ref 0.0–40.0)

## 2022-07-23 NOTE — Telephone Encounter (Signed)
-----   Message from Alvina Chou sent at 07/23/2022  8:28 AM EDT ----- Regarding: Lab orders for now Lab orders for cpx

## 2022-07-27 ENCOUNTER — Other Ambulatory Visit: Payer: Self-pay | Admitting: Family Medicine

## 2022-07-27 MED ORDER — PRAVASTATIN SODIUM 10 MG PO TABS: ORAL_TABLET | ORAL | 3 refills | Status: DC

## 2022-08-23 ENCOUNTER — Encounter: Payer: Self-pay | Admitting: Family Medicine

## 2022-09-03 ENCOUNTER — Ambulatory Visit (INDEPENDENT_AMBULATORY_CARE_PROVIDER_SITE_OTHER): Payer: BC Managed Care – PPO

## 2022-09-03 DIAGNOSIS — Z23 Encounter for immunization: Secondary | ICD-10-CM

## 2022-10-15 ENCOUNTER — Other Ambulatory Visit: Payer: Self-pay

## 2022-10-19 ENCOUNTER — Other Ambulatory Visit: Payer: Self-pay

## 2022-10-19 MED ORDER — COVID-19 MRNA 2023-2024 VACCINE (COMIRNATY) 0.3 ML INJECTION
0.3000 mL | Freq: Once | INTRAMUSCULAR | 0 refills | Status: AC
  Filled 2022-10-19: qty 0.3, 1d supply, fill #0

## 2022-10-27 IMAGING — MG MM DIGITAL SCREENING BILAT W/ TOMO AND CAD
8 series · 8 of 24 positions shown · non-contrast
Comparison: Previous exam(s).

CLINICAL DATA: Screening.

EXAM:
DIGITAL SCREENING BILATERAL MAMMOGRAM WITH TOMOSYNTHESIS AND CAD
TECHNIQUE: Bilateral screening digital craniocaudal and mediolateral oblique
mammograms were obtained. Bilateral screening digital breast
tomosynthesis was performed. The images were evaluated with
computer-aided detection.

[R MLO synth-2D]
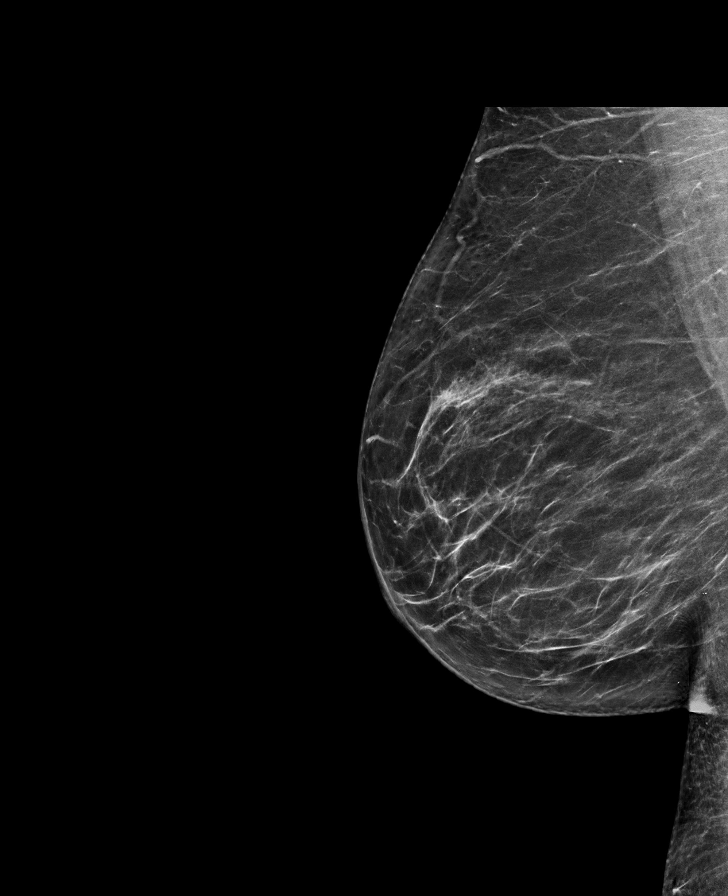

[L MLO synth-2D]
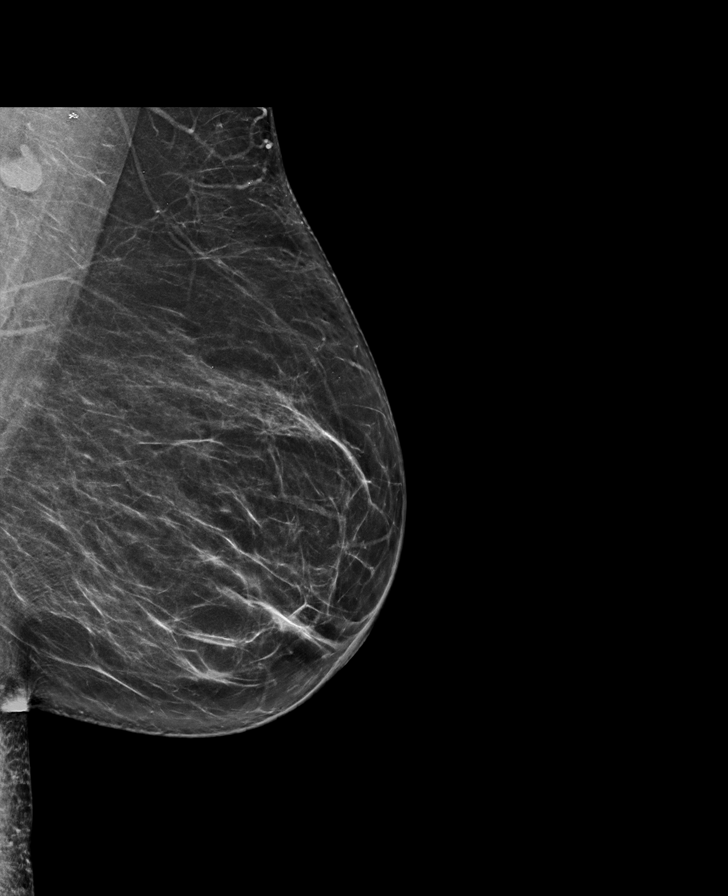

[L CC synth-2D]
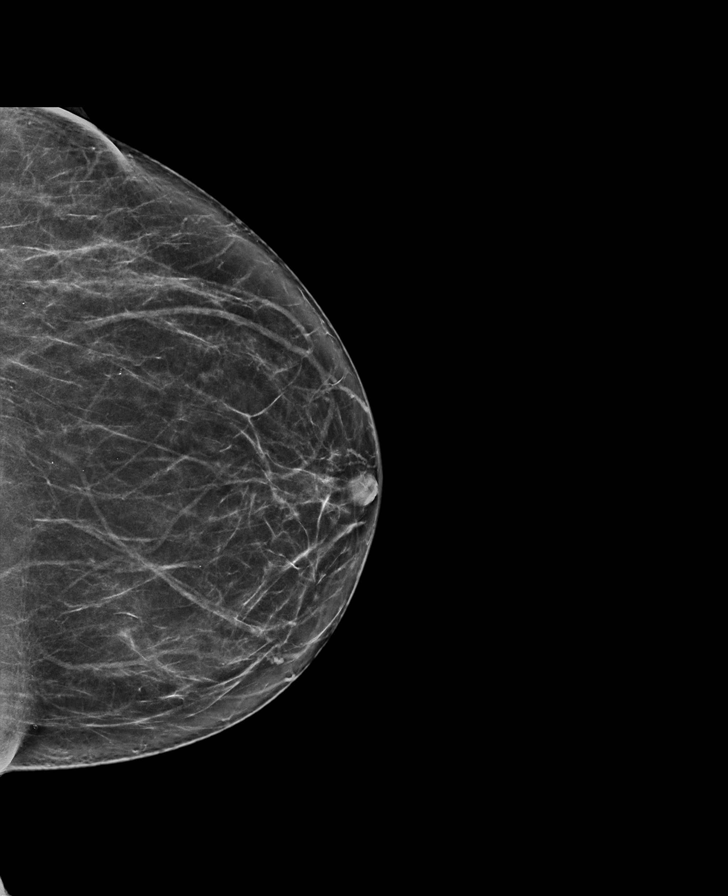

[R CC synth-2D]
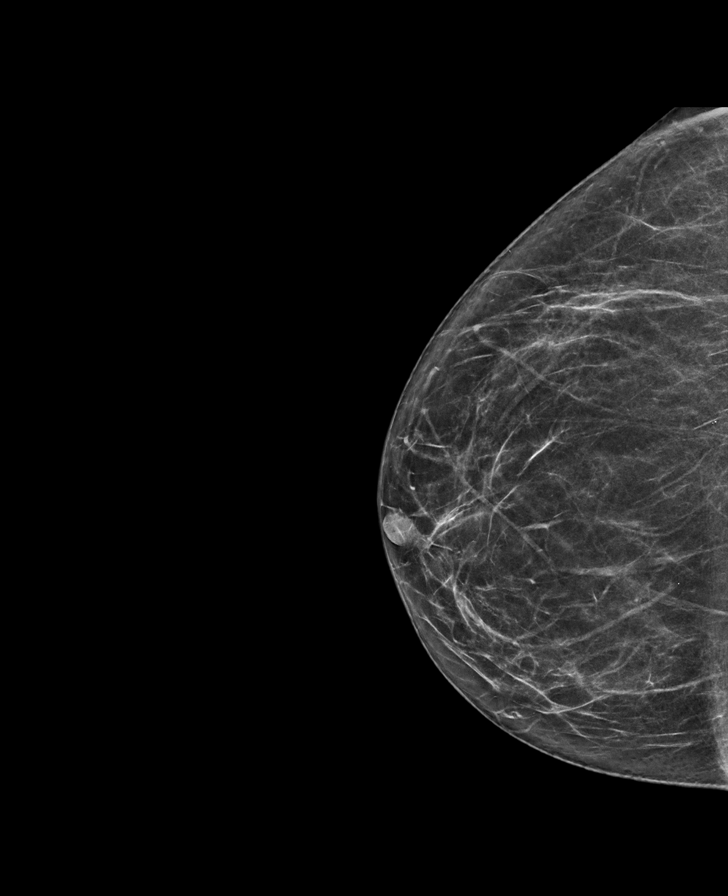

[R CC tomo · tomo slice 34/67.0]
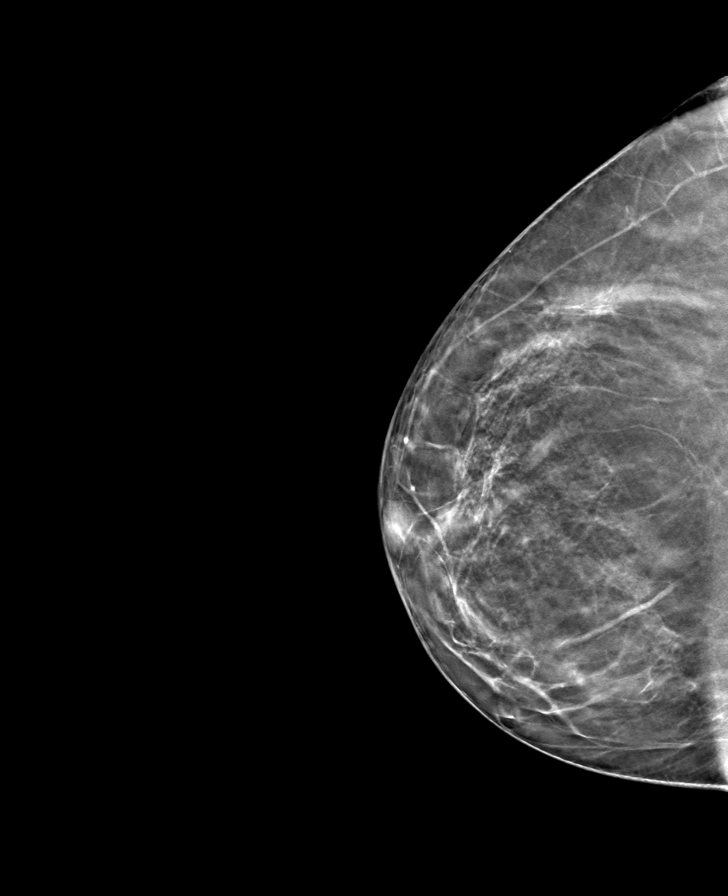

[L MLO tomo · tomo slice 37/72.0]
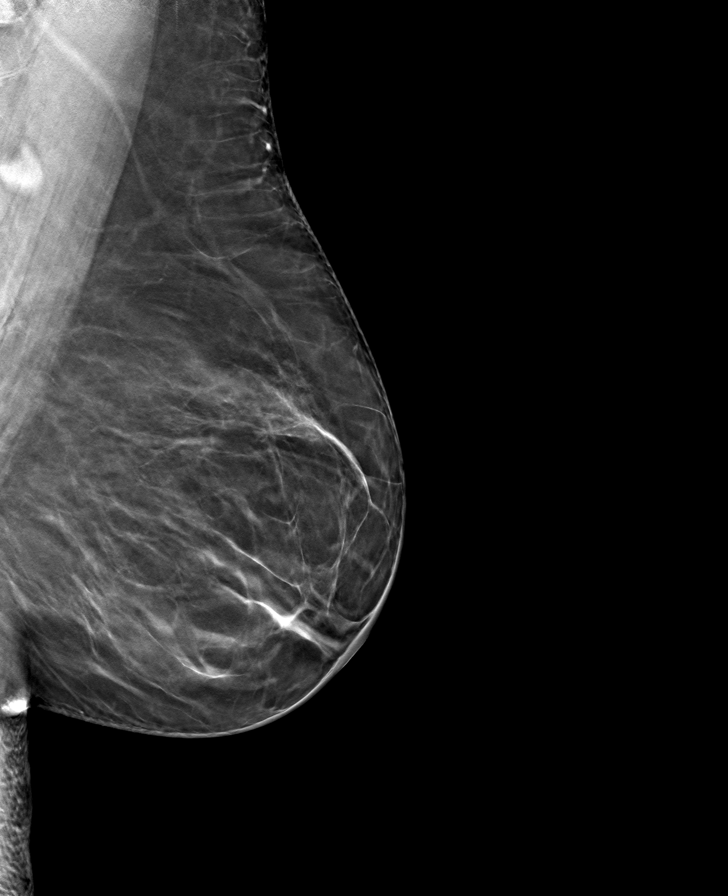

[L CC tomo · tomo slice 36/71.0]
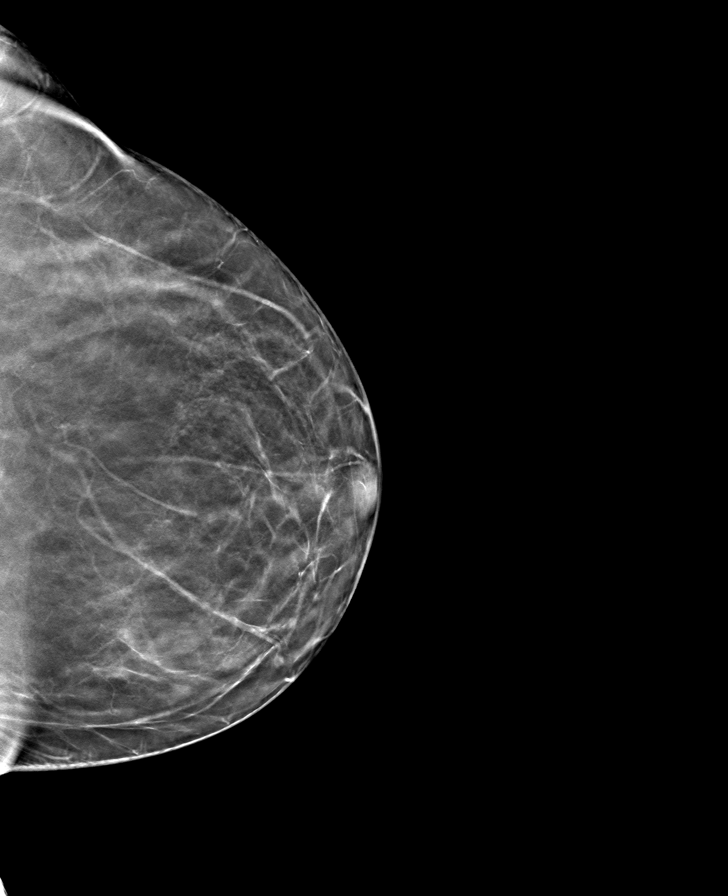

[R MLO tomo · tomo slice 37/73.0]
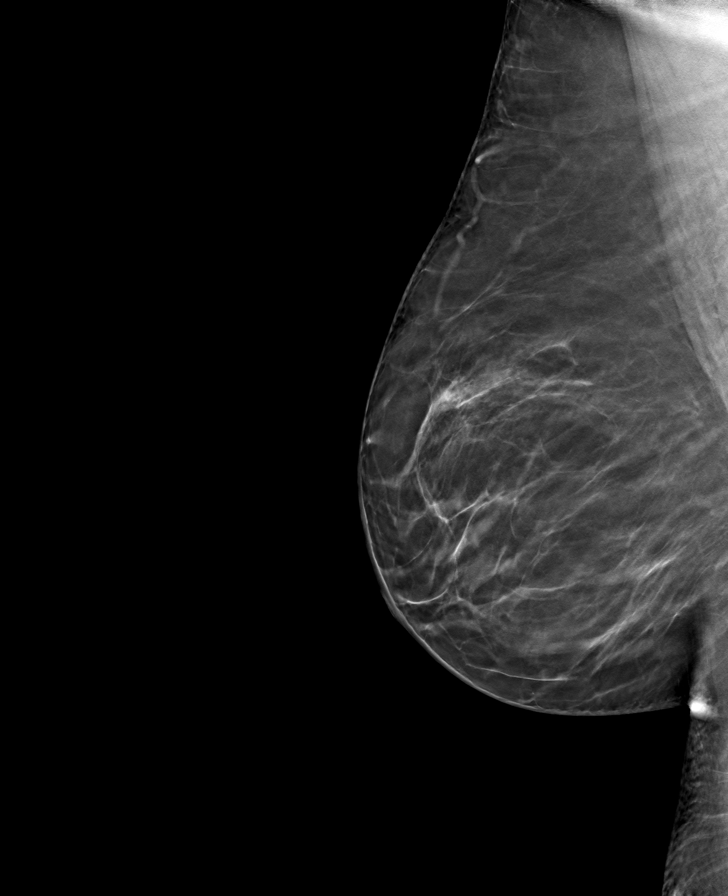

[8 of 24 positions shown; findings below may reference images not displayed]

ACR Breast Density Category b: There are scattered areas of
fibroglandular density.
FINDINGS: There are no findings suspicious for malignancy.
IMPRESSION: No mammographic evidence of malignancy. A result letter of this
screening mammogram will be mailed directly to the patient.

RECOMMENDATION:
Screening mammogram in one year. (Code:51-O-LD2)

BI-RADS CATEGORY  1: Negative.

## 2023-02-22 ENCOUNTER — Other Ambulatory Visit: Payer: Self-pay | Admitting: Family Medicine

## 2023-02-22 DIAGNOSIS — Z1231 Encounter for screening mammogram for malignant neoplasm of breast: Secondary | ICD-10-CM

## 2023-03-09 DIAGNOSIS — E038 Other specified hypothyroidism: Secondary | ICD-10-CM

## 2023-03-09 DIAGNOSIS — R7303 Prediabetes: Secondary | ICD-10-CM

## 2023-03-09 DIAGNOSIS — E7800 Pure hypercholesterolemia, unspecified: Secondary | ICD-10-CM

## 2023-03-30 NOTE — Telephone Encounter (Signed)
-----   Message from Ellamae Sia sent at 03/30/2023 11:03 AM EDT ----- Regarding: Lab orders for Friday, 4.26.24 Patient is scheduled for CPX labs, please order future labs, Thanks , Karna Christmas

## 2023-04-08 ENCOUNTER — Ambulatory Visit
Admission: RE | Admit: 2023-04-08 | Discharge: 2023-04-08 | Disposition: A | Discharge status: Home / Self Care | Payer: BC Managed Care – PPO | Source: Ambulatory Visit | Attending: Family Medicine | Admitting: Family Medicine

## 2023-04-08 DIAGNOSIS — Z1231 Encounter for screening mammogram for malignant neoplasm of breast: Secondary | ICD-10-CM | POA: Diagnosis not present

## 2023-04-22 ENCOUNTER — Other Ambulatory Visit (INDEPENDENT_AMBULATORY_CARE_PROVIDER_SITE_OTHER): Payer: BC Managed Care – PPO

## 2023-04-22 DIAGNOSIS — E038 Other specified hypothyroidism: Secondary | ICD-10-CM | POA: Diagnosis not present

## 2023-04-22 DIAGNOSIS — E7800 Pure hypercholesterolemia, unspecified: Secondary | ICD-10-CM | POA: Diagnosis not present

## 2023-04-22 DIAGNOSIS — R7303 Prediabetes: Secondary | ICD-10-CM

## 2023-04-22 LAB — COMPREHENSIVE METABOLIC PANEL
ALT: 21 U/L (ref 0–35)
AST: 16 U/L (ref 0–37)
Albumin: 4.5 g/dL (ref 3.5–5.2)
Alkaline Phosphatase: 84 U/L (ref 39–117)
BUN: 11 mg/dL (ref 6–23)
CO2: 28 mEq/L (ref 19–32)
Calcium: 9.8 mg/dL (ref 8.4–10.5)
Chloride: 100 mEq/L (ref 96–112)
Creatinine, Ser: 0.81 mg/dL (ref 0.40–1.20)
GFR: 77.84 mL/min (ref >60.00)
Glucose, Bld: 117 mg/dL — ABNORMAL HIGH (ref 70–99)
Potassium: 4.7 mEq/L (ref 3.5–5.1)
Sodium: 137 mEq/L (ref 135–145)
Total Bilirubin: 0.5 mg/dL (ref 0.2–1.2)
Total Protein: 7.8 g/dL (ref 6.0–8.3)

## 2023-04-22 LAB — LDL CHOLESTEROL, DIRECT: Direct LDL: 126 mg/dL

## 2023-04-22 LAB — TSH: TSH: 3.95 u[IU]/mL (ref 0.35–5.50)

## 2023-04-22 LAB — LIPID PANEL
Cholesterol: 193 mg/dL (ref 0–200)
HDL: 45.3 mg/dL (ref >39.00)
NonHDL: 147.8
Total CHOL/HDL Ratio: 4
Triglycerides: 229 mg/dL — ABNORMAL HIGH (ref 0.0–149.0)
VLDL: 45.8 mg/dL — ABNORMAL HIGH (ref 0.0–40.0)

## 2023-04-22 LAB — HEMOGLOBIN A1C: Hgb A1c MFr Bld: 5.9 % (ref 4.6–6.5)

## 2023-04-22 LAB — T3, FREE: T3, Free: 3.2 pg/mL (ref 2.3–4.2)

## 2023-04-22 LAB — T4, FREE: Free T4: 0.89 ng/dL (ref 0.60–1.60)

## 2023-04-22 NOTE — Progress Notes (Signed)
No critical labs need to be addressed urgently. We will discuss labs in detail at upcoming office visit.   

## 2023-04-29 ENCOUNTER — Ambulatory Visit (INDEPENDENT_AMBULATORY_CARE_PROVIDER_SITE_OTHER): Payer: BC Managed Care – PPO | Admitting: Family Medicine

## 2023-04-29 ENCOUNTER — Encounter: Payer: Self-pay | Admitting: Family Medicine

## 2023-04-29 VITALS — BP 144/64 | HR 95 | Temp 97.8°F | Ht 63.25 in | Wt 194.5 lb

## 2023-04-29 DIAGNOSIS — E038 Other specified hypothyroidism: Secondary | ICD-10-CM

## 2023-04-29 DIAGNOSIS — R7303 Prediabetes: Secondary | ICD-10-CM

## 2023-04-29 DIAGNOSIS — Z0000 Encounter for general adult medical examination without abnormal findings: Secondary | ICD-10-CM

## 2023-04-29 DIAGNOSIS — Z1211 Encounter for screening for malignant neoplasm of colon: Secondary | ICD-10-CM

## 2023-04-29 DIAGNOSIS — E7800 Pure hypercholesterolemia, unspecified: Secondary | ICD-10-CM

## 2023-04-29 DIAGNOSIS — I10 Essential (primary) hypertension: Secondary | ICD-10-CM | POA: Diagnosis not present

## 2023-04-29 MED ORDER — PRAVASTATIN SODIUM 20 MG PO TABS: ORAL_TABLET | ORAL | 3 refills | Status: DC

## 2023-04-29 NOTE — Assessment & Plan Note (Signed)
Encouraged exercise, weight loss, healthy eating habits. ? ?

## 2023-04-29 NOTE — Assessment & Plan Note (Signed)
Chronic, stable ? ?Remains asymptomatic, no indication for levothyroxine at this point. ?

## 2023-04-29 NOTE — Assessment & Plan Note (Addendum)
Chronic, improving control on low dose pravastatin 10 mg daily  5.5% risk of cardiovascular disease in the next 10 years  Will increae dose to 20 mg daily.  Zetia was not helpful in the past, minimally effective and is very costly.

## 2023-04-29 NOTE — Progress Notes (Signed)
Patient ID: Kara Burke, female    DOB: 1961-10-13, 62 y.o.   MRN: [id number]  This visit was conducted in person.  BP (!) 144/64 (BP Location: Left Arm, Patient Position: Sitting, Cuff Size: Large)   Pulse 95   Temp 97.8 F (36.6 C) (Temporal)   Ht 5' 3.25" (1.607 m)   Wt 194 lb 8 oz (88.2 kg)   SpO2 98%   BMI 34.18 kg/m    CC:  Chief Complaint  Patient presents with   Annual Exam    Subjective:   HPI: Kara Burke is a 62 y.o. female presenting on 04/29/2023 for Annual Exam   Hypertension:  Borderline elevation in office today previously well-controlled  on no medicaiton.  BP Readings from Last 3 Encounters:  04/29/23 (!) 144/64  04/20/22 138/78  01/27/21 (!) 162/84  Using medication without problems or lightheadedness:  none Chest pain with exertion: none Edema:none Short of breath: none Average home BP at home: 116-121/72-73 Other issues:  Elevated Cholesterol: improving control on low dose pravastatin 10 mg daily  Lab Results  Component Value Date   CHOL 193 04/22/2023   HDL 45.30 04/22/2023   LDLCALC 144 (H) 07/23/2022   LDLDIRECT 126.0 04/22/2023   TRIG 229.0 (H) 04/22/2023   CHOLHDL 4 04/22/2023  Using medications without problems:none Muscle aches:  none Diet compliance: heart healthy diet Exercise: walking 20 min a day. Other complaints: The 10-year ASCVD risk score (Arnett DK, et al., 2019) is: 5.5%   Values used to calculate the score:     Age: 47 years     Sex: Female     Is Non-Hispanic African American: No     Diabetic: No     Tobacco smoker: No     Systolic Blood Pressure: 144 mmHg     Is BP treated: No     HDL Cholesterol: 45.3 mg/dL     Total Cholesterol: 193 mg/dL     Prediabetes  Lab Results  Component Value Date   HGBA1C 5.9 04/22/2023   Hypothyroid, subclinical .. Stable on no medication. Lab Results  Component Value Date   TSH 3.95 04/22/2023  Free t3 and free t4  nml.   Flowsheet Row Office Visit from 04/29/2023 in [facility] [place]  PHQ-2 Total Score 0       Relevant past medical, surgical, family and social history reviewed and updated as indicated. Interim medical history since our last visit reviewed. Allergies and medications reviewed and updated. Outpatient Medications Prior to Visit  Medication Sig Dispense Refill   Cholecalciferol (PA VITAMIN D-3 GUMMY PO) Take by mouth.     [employer] Fiber Gummies 2 g CHEW Chew by mouth.     Multiple Vitamins-Minerals (WOMENS MULTIVITAMIN PO) Take 1 tablet by mouth daily.     omeprazole (PRILOSEC OTC) 20 MG tablet Take 20 mg by mouth daily as needed.     ACETAMINOPHEN PO Take by mouth.     pravastatin (PRAVACHOL) 10 MG tablet 1 tab po daily 90 tablet 3   COVID-19 mRNA bivalent vaccine, Pfizer, (PFIZER COVID-19 VAC BIVALENT) injection Inject into the muscle. 0.3 mL 0   COVID-19 mRNA Vac-TriS, Pfizer, (PFIZER-BIONT COVID-19 VAC-TRIS) SUSP injection Inject into the muscle. 0.3 mL 0   No facility-administered medications prior to visit.     Per HPI unless specifically indicated in ROS section below Review of Systems  Musculoskeletal:  Positive for arthralgias and back pain.  Low back pain radiating into left upper thigh No numbness or weakness  Right shoulder pain with external and internal rotation   Objective:  BP (!) 144/64 (BP Location: Left Arm, Patient Position: Sitting, Cuff Size: Large)   Pulse 95   Temp 97.8 F (36.6 C) (Temporal)   Ht 5' 3.25" (1.607 m)   Wt 194 lb 8 oz (88.2 kg)   SpO2 98%   BMI 34.18 kg/m   Wt Readings from Last 3 Encounters:  04/29/23 194 lb 8 oz (88.2 kg)  04/20/22 192 lb 9 oz (87.3 kg)  01/27/21 195 lb 12 oz (88.8 kg)      Physical Exam Vitals and nursing note reviewed.  Constitutional:      General: She is not in acute distress.    Appearance: Normal appearance. She is well-developed. She is not ill-appearing or  toxic-appearing.  HENT:     Head: Normocephalic.     Right Ear: Hearing, tympanic membrane, ear canal and external ear normal. Tympanic membrane is not erythematous, retracted or bulging.     Left Ear: Hearing, tympanic membrane, ear canal and external ear normal. Tympanic membrane is not erythematous, retracted or bulging.     Nose: Nose normal. No mucosal edema or rhinorrhea.     Right Sinus: No maxillary sinus tenderness or frontal sinus tenderness.     Left Sinus: No maxillary sinus tenderness or frontal sinus tenderness.     Mouth/Throat:     Pharynx: Uvula midline.  Eyes:     General: Lids are normal. Lids are everted, no foreign bodies appreciated.     Conjunctiva/sclera: Conjunctivae normal.     Pupils: Pupils are equal, round, and reactive to light.  Neck:     Thyroid: No thyroid mass or thyromegaly.     Vascular: No carotid bruit.     Trachea: Trachea normal.  Cardiovascular:     Rate and Rhythm: Normal rate and regular rhythm.     Pulses: Normal pulses.     Heart sounds: Normal heart sounds, S1 normal and S2 normal. No murmur heard.    No friction rub. No gallop.  Pulmonary:     Effort: Pulmonary effort is normal. No tachypnea or respiratory distress.     Breath sounds: Normal breath sounds. No decreased breath sounds, wheezing, rhonchi or rales.  Abdominal:     General: Bowel sounds are normal. There is no distension or abdominal bruit.     Palpations: Abdomen is soft. There is no fluid wave or mass.     Tenderness: There is no abdominal tenderness. There is no guarding or rebound.     Hernia: No hernia is present.  Musculoskeletal:     Cervical back: Normal range of motion and neck supple.  Lymphadenopathy:     Cervical: No cervical adenopathy.  Skin:    General: Skin is warm and dry.     Findings: No rash.  Neurological:     Mental Status: She is alert.     Cranial Nerves: No cranial nerve deficit.     Sensory: No sensory deficit.  Psychiatric:        Mood and  Affect: Mood is not anxious or depressed.        Speech: Speech normal.        Behavior: Behavior normal. Behavior is cooperative.        Thought Content: Thought content normal.        Judgment: Judgment normal.       Results for  orders placed or performed in visit on 04/22/23  T4, free  Result Value Ref Range   Free T4 0.89 0.60 - 1.60 ng/dL  T3, Free  Result Value Ref Range   T3, Free 3.2 2.3 - 4.2 pg/mL  TSH  Result Value Ref Range   TSH 3.95 0.35 - 5.50 uIU/mL  Lipid panel  Result Value Ref Range   Cholesterol 193 0 - 200 mg/dL   Triglycerides 478.2 (H) 0.0 - 149.0 mg/dL   HDL 95.62 >13.08 mg/dL   VLDL 65.7 (H) 0.0 - 84.6 mg/dL   Total CHOL/HDL Ratio 4    NonHDL 147.80   Hemoglobin A1c  Result Value Ref Range   Hgb A1c MFr Bld 5.9 4.6 - 6.5 %  Comprehensive metabolic panel  Result Value Ref Range   Sodium 137 135 - 145 mEq/L   Potassium 4.7 3.5 - 5.1 mEq/L   Chloride 100 96 - 112 mEq/L   CO2 28 19 - 32 mEq/L   Glucose, Bld 117 (H) 70 - 99 mg/dL   BUN 11 6 - 23 mg/dL   Creatinine, Ser 9.62 0.40 - 1.20 mg/dL   Total Bilirubin 0.5 0.2 - 1.2 mg/dL   Alkaline Phosphatase 84 39 - 117 U/L   AST 16 0 - 37 U/L   ALT 21 0 - 35 U/L   Total Protein 7.8 6.0 - 8.3 g/dL   Albumin 4.5 3.5 - 5.2 g/dL   GFR 95.28 >41.32 mL/min   Calcium 9.8 8.4 - 10.5 mg/dL  LDL cholesterol, direct  Result Value Ref Range   Direct LDL 126.0 mg/dL    This visit occurred during the SARS-CoV-2 public health emergency.  Safety protocols were in place, including screening questions prior to the visit, additional usage of staff PPE, and extensive cleaning of exam room while observing appropriate contact time as indicated for disinfecting solutions.   COVID 19 screen:  No recent travel or known exposure to COVID19 The patient denies respiratory symptoms of COVID 19 at this time. The importance of social distancing was discussed today.   Assessment and Plan The patient's preventative maintenance  and recommended screening tests for an annual wellness exam were reviewed in full today. Brought up to date unless services declined.  Counselled on the importance of diet, exercise, and its role in overall health and mortality. The patient's FH and SH was reviewed, including their home life, tobacco status, and drug and alcohol status.     Vaccines: uptodate shingrix, COVID x 5,  TDap uptodate Pap/DVE:  Last pap 2022.Marland Kitchen Plan q 5 years given neg HPV. No family history of ovarian cancer. Asymptomatic.  Mammo:03/2023 Bone Density:low risk.. start age 76-65 Colon:  No family history... Will do yearly stool cards., negative 03/2022 Smoking Status: Remote former, asymptomatic  Hep C:  done  HIV screen:   Refused.  Problem List Items Addressed This Visit     Essential hypertension, benign    Well-controlled at home labs on no medication      Relevant Medications   pravastatin (PRAVACHOL) 20 MG tablet   HYPERCHOLESTEROLEMIA    Chronic, improving control on low dose pravastatin 10 mg daily  5.5% risk of cardiovascular disease in the next 10 years  Will increae dose to 20 mg daily.  Zetia was not helpful in the past, minimally effective and is very costly.       Relevant Medications   pravastatin (PRAVACHOL) 20 MG tablet   Other Relevant Orders   Lipid panel  Comprehensive metabolic panel   Prediabetes    Encouraged exercise, weight loss, healthy eating habits.       Subclinical hypothyroidism    Chronic, stable  Remains asymptomatic, no indication for levothyroxine at this point.      Other Visit Diagnoses     Routine general medical examination at a health care facility    -  Primary   Colon cancer screening       Relevant Orders   Fecal occult blood, imunochemical      Kerby Nora, MD

## 2023-04-29 NOTE — Patient Instructions (Signed)
Pick up stool test in lab on way out.

## 2023-04-29 NOTE — Assessment & Plan Note (Addendum)
Well-controlled at home labs on no medication

## 2023-05-02 ENCOUNTER — Other Ambulatory Visit (INDEPENDENT_AMBULATORY_CARE_PROVIDER_SITE_OTHER): Payer: BC Managed Care – PPO | Admitting: Radiology

## 2023-05-02 DIAGNOSIS — Z1211 Encounter for screening for malignant neoplasm of colon: Secondary | ICD-10-CM | POA: Diagnosis not present

## 2023-05-02 LAB — FECAL OCCULT BLOOD, IMMUNOCHEMICAL: Fecal Occult Bld: NEGATIVE

## 2023-08-02 ENCOUNTER — Other Ambulatory Visit (INDEPENDENT_AMBULATORY_CARE_PROVIDER_SITE_OTHER): Payer: BC Managed Care – PPO

## 2023-08-02 ENCOUNTER — Telehealth: Payer: Self-pay | Admitting: Family Medicine

## 2023-08-02 DIAGNOSIS — R7303 Prediabetes: Secondary | ICD-10-CM

## 2023-08-02 DIAGNOSIS — E7800 Pure hypercholesterolemia, unspecified: Secondary | ICD-10-CM

## 2023-08-02 LAB — COMPREHENSIVE METABOLIC PANEL
ALT: 19 U/L (ref 0–35)
AST: 16 U/L (ref 0–37)
Albumin: 4.5 g/dL (ref 3.5–5.2)
Alkaline Phosphatase: 83 U/L (ref 39–117)
BUN: 11 mg/dL (ref 6–23)
CO2: 26 mEq/L (ref 19–32)
Calcium: 9.7 mg/dL (ref 8.4–10.5)
Chloride: 102 mEq/L (ref 96–112)
Creatinine, Ser: 0.88 mg/dL (ref 0.40–1.20)
GFR: 70.33 mL/min (ref >60.00)
Glucose, Bld: 136 mg/dL — ABNORMAL HIGH (ref 70–99)
Potassium: 4.4 mEq/L (ref 3.5–5.1)
Sodium: 138 mEq/L (ref 135–145)
Total Bilirubin: 0.5 mg/dL (ref 0.2–1.2)
Total Protein: 7.4 g/dL (ref 6.0–8.3)

## 2023-08-02 LAB — LIPID PANEL
Cholesterol: 183 mg/dL (ref 0–200)
HDL: 49 mg/dL (ref >39.00)
LDL Cholesterol: 95 mg/dL (ref 0–99)
NonHDL: 133.63
Total CHOL/HDL Ratio: 4
Triglycerides: 191 mg/dL — ABNORMAL HIGH (ref 0.0–149.0)
VLDL: 38.2 mg/dL (ref 0.0–40.0)

## 2023-08-02 LAB — HEMOGLOBIN A1C: Hgb A1c MFr Bld: 5.8 % (ref 4.6–6.5)

## 2023-08-02 NOTE — Telephone Encounter (Signed)
-----   Message from Alvina Chou sent at 08/02/2023  7:35 AM EDT ----- Regarding: Lab orders for now Lab orders, thanks

## 2023-09-13 ENCOUNTER — Ambulatory Visit (INDEPENDENT_AMBULATORY_CARE_PROVIDER_SITE_OTHER): Payer: BC Managed Care – PPO

## 2023-09-13 DIAGNOSIS — Z23 Encounter for immunization: Secondary | ICD-10-CM

## 2024-02-23 ENCOUNTER — Other Ambulatory Visit: Payer: Self-pay | Admitting: Family Medicine

## 2024-02-23 DIAGNOSIS — Z1231 Encounter for screening mammogram for malignant neoplasm of breast: Secondary | ICD-10-CM

## 2024-04-02 ENCOUNTER — Telehealth: Payer: Self-pay | Admitting: *Deleted

## 2024-04-02 DIAGNOSIS — E038 Other specified hypothyroidism: Secondary | ICD-10-CM

## 2024-04-02 DIAGNOSIS — R7303 Prediabetes: Secondary | ICD-10-CM

## 2024-04-02 DIAGNOSIS — E7800 Pure hypercholesterolemia, unspecified: Secondary | ICD-10-CM

## 2024-04-02 NOTE — Telephone Encounter (Signed)
-----   Message from Alvina Chou sent at 04/02/2024  3:43 PM EDT ----- Regarding: Lab orders for Tue, 4.29.25 Patient is scheduled for CPX labs, please order future labs, Thanks , Camelia Eng

## 2024-04-09 ENCOUNTER — Ambulatory Visit
Admission: RE | Admit: 2024-04-09 | Discharge: 2024-04-09 | Disposition: A | Discharge status: Home / Self Care | Payer: BC Managed Care – PPO | Source: Ambulatory Visit | Attending: Family Medicine | Admitting: Family Medicine

## 2024-04-09 DIAGNOSIS — Z1231 Encounter for screening mammogram for malignant neoplasm of breast: Secondary | ICD-10-CM | POA: Diagnosis not present

## 2024-04-22 ENCOUNTER — Other Ambulatory Visit: Payer: Self-pay | Admitting: Family Medicine

## 2024-04-24 ENCOUNTER — Encounter: Payer: Self-pay | Admitting: Family Medicine

## 2024-04-24 ENCOUNTER — Other Ambulatory Visit (INDEPENDENT_AMBULATORY_CARE_PROVIDER_SITE_OTHER): Payer: BC Managed Care – PPO

## 2024-04-24 DIAGNOSIS — R7303 Prediabetes: Secondary | ICD-10-CM

## 2024-04-24 DIAGNOSIS — E7800 Pure hypercholesterolemia, unspecified: Secondary | ICD-10-CM

## 2024-04-24 DIAGNOSIS — E038 Other specified hypothyroidism: Secondary | ICD-10-CM

## 2024-04-24 LAB — COMPREHENSIVE METABOLIC PANEL WITH GFR
ALT: 18 U/L (ref 0–35)
AST: 12 U/L (ref 0–37)
Albumin: 4.6 g/dL (ref 3.5–5.2)
Alkaline Phosphatase: 81 U/L (ref 39–117)
BUN: 15 mg/dL (ref 6–23)
CO2: 29 meq/L (ref 19–32)
Calcium: 9.9 mg/dL (ref 8.4–10.5)
Chloride: 100 meq/L (ref 96–112)
Creatinine, Ser: 0.75 mg/dL (ref 0.40–1.20)
GFR: 84.77 mL/min (ref >60.00)
Glucose, Bld: 117 mg/dL — ABNORMAL HIGH (ref 70–99)
Potassium: 4.5 meq/L (ref 3.5–5.1)
Sodium: 137 meq/L (ref 135–145)
Total Bilirubin: 0.5 mg/dL (ref 0.2–1.2)
Total Protein: 7.6 g/dL (ref 6.0–8.3)

## 2024-04-24 LAB — TSH: TSH: 5.11 u[IU]/mL (ref 0.35–5.50)

## 2024-04-24 LAB — LIPID PANEL
Cholesterol: 194 mg/dL (ref 0–200)
HDL: 46.9 mg/dL (ref >39.00)
LDL Cholesterol: 98 mg/dL (ref 0–99)
NonHDL: 147.17
Total CHOL/HDL Ratio: 4
Triglycerides: 245 mg/dL — ABNORMAL HIGH (ref 0.0–149.0)
VLDL: 49 mg/dL — ABNORMAL HIGH (ref 0.0–40.0)

## 2024-04-24 LAB — HEMOGLOBIN A1C: Hgb A1c MFr Bld: 5.8 % (ref 4.6–6.5)

## 2024-04-24 LAB — T4, FREE: Free T4: 0.68 ng/dL (ref 0.60–1.60)

## 2024-04-24 LAB — T3, FREE: T3, Free: 3.4 pg/mL (ref 2.3–4.2)

## 2024-04-24 NOTE — Progress Notes (Signed)
 No critical labs need to be addressed urgently. We will discuss labs in detail at upcoming office visit.

## 2024-05-01 ENCOUNTER — Encounter: Payer: Self-pay | Admitting: Family Medicine

## 2024-05-01 ENCOUNTER — Ambulatory Visit: Payer: BC Managed Care – PPO | Admitting: Family Medicine

## 2024-05-01 VITALS — BP 132/79 | HR 70 | Temp 97.5°F | Ht 62.75 in | Wt 190.5 lb

## 2024-05-01 DIAGNOSIS — E7800 Pure hypercholesterolemia, unspecified: Secondary | ICD-10-CM | POA: Diagnosis not present

## 2024-05-01 DIAGNOSIS — E038 Other specified hypothyroidism: Secondary | ICD-10-CM

## 2024-05-01 DIAGNOSIS — R7303 Prediabetes: Secondary | ICD-10-CM | POA: Diagnosis not present

## 2024-05-01 DIAGNOSIS — Z1211 Encounter for screening for malignant neoplasm of colon: Secondary | ICD-10-CM

## 2024-05-01 DIAGNOSIS — Z0000 Encounter for general adult medical examination without abnormal findings: Secondary | ICD-10-CM

## 2024-05-01 DIAGNOSIS — I10 Essential (primary) hypertension: Secondary | ICD-10-CM

## 2024-05-01 MED ORDER — PRAVASTATIN SODIUM 40 MG PO TABS: 40.0000 mg | ORAL_TABLET | Freq: Every day | ORAL | 3 refills | Status: AC

## 2024-05-01 NOTE — Assessment & Plan Note (Signed)
Well-controlled at home labs on no medication

## 2024-05-01 NOTE — Assessment & Plan Note (Signed)
 Encouraged exercise, weight loss, healthy eating habits. ? ?

## 2024-05-01 NOTE — Progress Notes (Signed)
 Patient ID: Kara Burke, female    DOB: 1960/12/30, 63 y.o.   MRN: [id number]  This visit was conducted in person.  BP 132/79   Pulse 70   Temp (!) 97.5 F (36.4 C) (Temporal)   Ht 5' 2.75" (1.594 m)   Wt 190 lb 8 oz (86.4 kg)   SpO2 97%   BMI 34.01 kg/m    CC:  Chief Complaint  Patient presents with   Annual Exam    Subjective:   HPI: Kara Burke is a 63 y.o. female presenting on 05/01/2024 for Annual Exam   Hypertension:  Borderline elevation in office today previously well-controlled  on no medicaiton. ... Good control at home BP Readings from Last 3 Encounters:  05/01/24 132/79  04/29/23 (!) 144/64  04/20/22 138/78  Using medication without problems or lightheadedness:  none Chest pain with exertion: none Edema:none Short of breath: none Average home BP at home: 116-121/72-73 Other issues:  Elevated Cholesterol: improving control on low dose pravastatin  20 mg daily  Lab Results  Component Value Date   CHOL 194 04/24/2024   HDL 46.90 04/24/2024   LDLCALC 98 04/24/2024   LDLDIRECT 126.0 04/22/2023   TRIG 245.0 (H) 04/24/2024   CHOLHDL 4 04/24/2024  Using medications without problems:none Muscle aches:  none Diet compliance: heart healthy diet Exercise: walking 20 min a day. Other complaints: The 10-year ASCVD risk score (Arnett DK, et al., 2019) is: 5.1%   Values used to calculate the score:     Age: 38 years     Sex: Female     Is Non-Hispanic African American: No     Diabetic: No     Tobacco smoker: No     Systolic Blood Pressure: 132 mmHg     Is BP treated: No     HDL Cholesterol: 46.9 mg/dL     Total Cholesterol: 194 mg/dL     Prediabetes  Lab Results  Component Value Date   HGBA1C 5.8 04/24/2024   Hypothyroid, subclinical .. Stable on no medication. Lab Results  Component Value Date   TSH 5.11 04/24/2024  Free t3 and free t4 nml.   Flowsheet Row Office Visit from 05/01/2024 in [facility]  PHQ-2 Total Score 0       Relevant past medical, surgical, family and social history reviewed and updated as indicated. Interim medical history since our last visit reviewed. Allergies and medications reviewed and updated. Outpatient Medications Prior to Visit  Medication Sig Dispense Refill   Cholecalciferol (PA VITAMIN D-3 GUMMY PO) Take 2 each by mouth daily.     [employer] Fiber Gummies 2 g CHEW Chew by mouth.     Multiple Vitamins-Minerals (WOMENS MULTIVITAMIN PO) Take 1 tablet by mouth daily.     omeprazole (PRILOSEC OTC) 20 MG tablet Take 20 mg by mouth daily as needed.     pravastatin  (PRAVACHOL ) 20 MG tablet TAKE 1 TABLET BY MOUTH EVERY DAY 90 tablet 0   No facility-administered medications prior to visit.     Per HPI unless specifically indicated in ROS section below Review of Systems  Musculoskeletal:  Positive for arthralgias and back pain.       Low back pain radiating into left upper thigh No numbness or weakness  Right shoulder pain with external and internal rotation   Objective:  BP 132/79   Pulse 70   Temp (!) 97.5 F (36.4 C) (Temporal)   Ht  5' 2.75" (1.594 m)   Wt 190 lb 8 oz (86.4 kg)   SpO2 97%   BMI 34.01 kg/m   Wt Readings from Last 3 Encounters:  05/01/24 190 lb 8 oz (86.4 kg)  04/29/23 194 lb 8 oz (88.2 kg)  04/20/22 192 lb 9 oz (87.3 kg)      Physical Exam Vitals and nursing note reviewed.  Constitutional:      General: She is not in acute distress.    Appearance: Normal appearance. She is well-developed. She is not ill-appearing or toxic-appearing.  HENT:     Head: Normocephalic.     Right Ear: Hearing, tympanic membrane, ear canal and external ear normal. Tympanic membrane is not erythematous, retracted or bulging.     Left Ear: Hearing, tympanic membrane, ear canal and external ear normal. Tympanic membrane is not erythematous, retracted or bulging.     Nose: Nose normal. No mucosal edema or rhinorrhea.      Right Sinus: No maxillary sinus tenderness or frontal sinus tenderness.     Left Sinus: No maxillary sinus tenderness or frontal sinus tenderness.     Mouth/Throat:     Pharynx: Uvula midline.  Eyes:     General: Lids are normal. Lids are everted, no foreign bodies appreciated.     Conjunctiva/sclera: Conjunctivae normal.     Pupils: Pupils are equal, round, and reactive to light.  Neck:     Thyroid : No thyroid  mass or thyromegaly.     Vascular: No carotid bruit.     Trachea: Trachea normal.  Cardiovascular:     Rate and Rhythm: Normal rate and regular rhythm.     Pulses: Normal pulses.     Heart sounds: Normal heart sounds, S1 normal and S2 normal. No murmur heard.    No friction rub. No gallop.  Pulmonary:     Effort: Pulmonary effort is normal. No tachypnea or respiratory distress.     Breath sounds: Normal breath sounds. No decreased breath sounds, wheezing, rhonchi or rales.  Abdominal:     General: Bowel sounds are normal. There is no distension or abdominal bruit.     Palpations: Abdomen is soft. There is no fluid wave or mass.     Tenderness: There is no abdominal tenderness. There is no guarding or rebound.     Hernia: No hernia is present.  Musculoskeletal:     Cervical back: Normal range of motion and neck supple.  Lymphadenopathy:     Cervical: No cervical adenopathy.  Skin:    General: Skin is warm and dry.     Findings: No rash.  Neurological:     Mental Status: She is alert.     Cranial Nerves: No cranial nerve deficit.     Sensory: No sensory deficit.  Psychiatric:        Mood and Affect: Mood is not anxious or depressed.        Speech: Speech normal.        Behavior: Behavior normal. Behavior is cooperative.        Thought Content: Thought content normal.        Judgment: Judgment normal.       Results for orders placed or performed in visit on 04/24/24  T4, free   Collection Time: 04/24/24  7:39 AM  Result Value Ref Range   Free T4 0.68 0.60 - 1.60  ng/dL  T3, Free   Collection Time: 04/24/24  7:39 AM  Result Value Ref Range   T3, Free  3.4 2.3 - 4.2 pg/mL  TSH   Collection Time: 04/24/24  7:39 AM  Result Value Ref Range   TSH 5.11 0.35 - 5.50 uIU/mL  Lipid panel   Collection Time: 04/24/24  7:39 AM  Result Value Ref Range   Cholesterol 194 0 - 200 mg/dL   Triglycerides 161.0 (H) 0.0 - 149.0 mg/dL   HDL 96.04 >54.09 mg/dL   VLDL 81.1 (H) 0.0 - 91.4 mg/dL   LDL Cholesterol 98 0 - 99 mg/dL   Total CHOL/HDL Ratio 4    NonHDL 147.17   Hemoglobin A1c   Collection Time: 04/24/24  7:39 AM  Result Value Ref Range   Hgb A1c MFr Bld 5.8 4.6 - 6.5 %  Comprehensive metabolic panel   Collection Time: 04/24/24  7:39 AM  Result Value Ref Range   Sodium 137 135 - 145 mEq/L   Potassium 4.5 3.5 - 5.1 mEq/L   Chloride 100 96 - 112 mEq/L   CO2 29 19 - 32 mEq/L   Glucose, Bld 117 (H) 70 - 99 mg/dL   BUN 15 6 - 23 mg/dL   Creatinine, Ser 7.82 0.40 - 1.20 mg/dL   Total Bilirubin 0.5 0.2 - 1.2 mg/dL   Alkaline Phosphatase 81 39 - 117 U/L   AST 12 0 - 37 U/L   ALT 18 0 - 35 U/L   Total Protein 7.6 6.0 - 8.3 g/dL   Albumin 4.6 3.5 - 5.2 g/dL   GFR 95.62 >13.08 mL/min   Calcium  9.9 8.4 - 10.5 mg/dL    This visit occurred during the SARS-CoV-2 public health emergency.  Safety protocols were in place, including screening questions prior to the visit, additional usage of staff PPE, and extensive cleaning of exam room while observing appropriate contact time as indicated for disinfecting solutions.   COVID 19 screen:  No recent travel or known exposure to COVID19 The patient denies respiratory symptoms of COVID 19 at this time. The importance of social distancing was discussed today.   Assessment and Plan The patient's preventative maintenance and recommended screening tests for an annual wellness exam were reviewed in full today. Brought up to date unless services declined.  Counselled on the importance of diet, exercise, and its role in  overall health and mortality. The patient's FH and SH was reviewed, including their home life, tobacco status, and drug and alcohol status.     Vaccines: uptodate shingrix , COVID x 5,  TDap uptodate Pap/DVE:  Last pap 2022.Aaron Aas Plan q 5 years given neg HPV. No family history of ovarian cancer. Asymptomatic.  Mammo:03/2024 Bone Density:low risk.. start age 7 Colon:  No family history... Will do yearly stool cards., negative 04/2023 Smoking Status: Remote former, asymptomatic  Hep C:  done  HIV screen:   Refused.  Problem List Items Addressed This Visit     Essential hypertension, benign   Well-controlled at home labs on no medication      HYPERCHOLESTEROLEMIA   Chronic, improving control on low dose pravastatin  20 mg daily  5.5% risk of cardiovascular disease in the next 10 years   Zetia  was not helpful in the past, minimally effective and is very costly.       Prediabetes   Encouraged exercise, weight loss, healthy eating habits.       Subclinical hypothyroidism   Chronic, stable  Remains asymptomatic, no indication for levothyroxine  at this point.      Other Visit Diagnoses       Routine general medical examination  at a health care facility    -  Primary       Herby Lolling, MD

## 2024-05-01 NOTE — Assessment & Plan Note (Addendum)
 Chronic, improving control on low dose pravastatin  20 mg daily  5.5% risk of cardiovascular disease in the next 10 yearsnths.  Will increase dose to  40 mg daily and re-eval in 3-6 m   Zetia  was not helpful in the past, minimally effective and is very costly.

## 2024-05-01 NOTE — Assessment & Plan Note (Signed)
Chronic, stable ? ?Remains asymptomatic, no indication for levothyroxine at this point. ?

## 2024-05-01 NOTE — Patient Instructions (Signed)
Increase pravastatin 40 mg daily

## 2024-05-02 ENCOUNTER — Other Ambulatory Visit: Payer: Self-pay | Admitting: Radiology

## 2024-05-02 DIAGNOSIS — Z1211 Encounter for screening for malignant neoplasm of colon: Secondary | ICD-10-CM

## 2024-05-03 ENCOUNTER — Encounter: Payer: Self-pay | Admitting: Family Medicine

## 2024-05-03 LAB — FECAL OCCULT BLOOD, IMMUNOCHEMICAL: Fecal Occult Bld: NEGATIVE

## 2024-08-13 ENCOUNTER — Encounter: Payer: Self-pay | Admitting: Family Medicine

## 2024-09-03 ENCOUNTER — Other Ambulatory Visit (INDEPENDENT_AMBULATORY_CARE_PROVIDER_SITE_OTHER)

## 2024-09-03 ENCOUNTER — Ambulatory Visit: Payer: Self-pay | Admitting: Family Medicine

## 2024-09-03 DIAGNOSIS — E7800 Pure hypercholesterolemia, unspecified: Secondary | ICD-10-CM | POA: Diagnosis not present

## 2024-09-03 LAB — COMPREHENSIVE METABOLIC PANEL WITH GFR
ALT: 19 U/L (ref 0–35)
AST: 16 U/L (ref 0–37)
Albumin: 4.5 g/dL (ref 3.5–5.2)
Alkaline Phosphatase: 78 U/L (ref 39–117)
BUN: 17 mg/dL (ref 6–23)
CO2: 28 meq/L (ref 19–32)
Calcium: 10 mg/dL (ref 8.4–10.5)
Chloride: 100 meq/L (ref 96–112)
Creatinine, Ser: 0.84 mg/dL (ref 0.40–1.20)
GFR: 73.8 mL/min (ref >60.00)
Glucose, Bld: 130 mg/dL — ABNORMAL HIGH (ref 70–99)
Potassium: 4.4 meq/L (ref 3.5–5.1)
Sodium: 138 meq/L (ref 135–145)
Total Bilirubin: 0.5 mg/dL (ref 0.2–1.2)
Total Protein: 7.8 g/dL (ref 6.0–8.3)

## 2024-09-03 LAB — LIPID PANEL
Cholesterol: 201 mg/dL — ABNORMAL HIGH (ref 0–200)
HDL: 52 mg/dL (ref >39.00)
LDL Cholesterol: 105 mg/dL — ABNORMAL HIGH (ref 0–99)
NonHDL: 148.93
Total CHOL/HDL Ratio: 4
Triglycerides: 218 mg/dL — ABNORMAL HIGH (ref 0.0–149.0)
VLDL: 43.6 mg/dL — ABNORMAL HIGH (ref 0.0–40.0)

## 2024-09-03 NOTE — Progress Notes (Signed)
 No critical labs need to be addressed urgently. We will discuss labs in detail at upcoming office visit.

## 2024-09-11 ENCOUNTER — Ambulatory Visit (INDEPENDENT_AMBULATORY_CARE_PROVIDER_SITE_OTHER)

## 2024-09-11 DIAGNOSIS — Z23 Encounter for immunization: Secondary | ICD-10-CM

## 2024-09-11 NOTE — Progress Notes (Signed)
 Per orders of Dr. Greig Ring, injection of fluzone 6 months to 65  given by Laray Arenas in left deltoid. Patient tolerated injection well.

## 2025-04-25 ENCOUNTER — Other Ambulatory Visit

## 2025-05-02 ENCOUNTER — Encounter: Admitting: Family Medicine
